# Patient Record
Sex: Female | Born: 1940 | Race: White | Hispanic: No | Smoking: Never smoker
Health system: Southern US, Community
[De-identification: ages and names within clinical notes are randomized; demographics above are authoritative.]

## PROBLEM LIST (undated history)

## (undated) DIAGNOSIS — I4891 Unspecified atrial fibrillation: Secondary | ICD-10-CM

## (undated) DIAGNOSIS — IMO0002 Reserved for concepts with insufficient information to code with codable children: Secondary | ICD-10-CM

## (undated) DIAGNOSIS — C801 Malignant (primary) neoplasm, unspecified: Secondary | ICD-10-CM

## (undated) DIAGNOSIS — K219 Gastro-esophageal reflux disease without esophagitis: Secondary | ICD-10-CM

## (undated) DIAGNOSIS — W19XXXA Unspecified fall, initial encounter: Secondary | ICD-10-CM

## (undated) DIAGNOSIS — D649 Anemia, unspecified: Secondary | ICD-10-CM

## (undated) DIAGNOSIS — M810 Age-related osteoporosis without current pathological fracture: Secondary | ICD-10-CM

## (undated) DIAGNOSIS — I10 Essential (primary) hypertension: Secondary | ICD-10-CM

## (undated) DIAGNOSIS — Z9221 Personal history of antineoplastic chemotherapy: Secondary | ICD-10-CM

## (undated) DIAGNOSIS — E669 Obesity, unspecified: Secondary | ICD-10-CM

## (undated) HISTORY — DX: Gastro-esophageal reflux disease without esophagitis: K21.9

## (undated) HISTORY — DX: Essential (primary) hypertension: I10

## (undated) HISTORY — PX: BREAST BIOPSY: SHX20

## (undated) HISTORY — PX: COLONOSCOPY: SHX174

## (undated) HISTORY — DX: Malignant (primary) neoplasm, unspecified: C80.1

## (undated) HISTORY — DX: Reserved for concepts with insufficient information to code with codable children: IMO0002

## (undated) HISTORY — DX: Unspecified fall, initial encounter: W19.XXXA

## (undated) HISTORY — DX: Personal history of antineoplastic chemotherapy: Z92.21

## (undated) HISTORY — DX: Age-related osteoporosis without current pathological fracture: M81.0

## (undated) HISTORY — DX: Anemia, unspecified: D64.9

## (undated) HISTORY — PX: OTHER SURGICAL HISTORY: SHX169

## (undated) HISTORY — PX: SKIN LESION EXCISION: SHX2412

## (undated) HISTORY — DX: Obesity, unspecified: E66.9

---

## 2004-10-14 ENCOUNTER — Ambulatory Visit: Payer: Self-pay | Admitting: General Surgery

## 2005-05-12 ENCOUNTER — Ambulatory Visit: Payer: Self-pay | Admitting: General Surgery

## 2006-03-11 ENCOUNTER — Ambulatory Visit: Payer: Self-pay | Admitting: Unknown Physician Specialty

## 2006-06-09 ENCOUNTER — Ambulatory Visit: Payer: Self-pay | Admitting: General Surgery

## 2007-01-06 ENCOUNTER — Ambulatory Visit: Payer: Self-pay | Admitting: General Surgery

## 2007-05-06 ENCOUNTER — Ambulatory Visit: Payer: Self-pay | Admitting: Internal Medicine

## 2007-07-07 ENCOUNTER — Ambulatory Visit: Payer: Self-pay | Admitting: General Surgery

## 2008-07-10 ENCOUNTER — Ambulatory Visit: Payer: Self-pay | Admitting: General Surgery

## 2009-05-25 ENCOUNTER — Ambulatory Visit: Payer: Self-pay | Admitting: General Surgery

## 2009-07-06 DIAGNOSIS — C801 Malignant (primary) neoplasm, unspecified: Secondary | ICD-10-CM

## 2009-07-06 HISTORY — DX: Malignant (primary) neoplasm, unspecified: C80.1

## 2009-07-19 ENCOUNTER — Ambulatory Visit: Payer: Self-pay | Admitting: General Surgery

## 2009-08-02 ENCOUNTER — Ambulatory Visit: Payer: Self-pay | Admitting: General Surgery

## 2009-08-06 ENCOUNTER — Ambulatory Visit: Payer: Self-pay | Admitting: Oncology

## 2009-08-07 ENCOUNTER — Ambulatory Visit: Payer: Self-pay | Admitting: General Surgery

## 2009-08-14 ENCOUNTER — Ambulatory Visit: Payer: Self-pay | Admitting: Oncology

## 2009-08-21 ENCOUNTER — Ambulatory Visit: Payer: Self-pay | Admitting: General Surgery

## 2009-08-23 ENCOUNTER — Ambulatory Visit: Payer: Self-pay | Admitting: General Surgery

## 2009-09-05 ENCOUNTER — Ambulatory Visit: Payer: Self-pay | Admitting: Oncology

## 2009-09-25 ENCOUNTER — Inpatient Hospital Stay: Payer: Self-pay | Admitting: Oncology

## 2009-10-06 ENCOUNTER — Ambulatory Visit: Payer: Self-pay | Admitting: Oncology

## 2009-10-06 HISTORY — PX: BREAST SURGERY: SHX581

## 2009-11-06 ENCOUNTER — Ambulatory Visit: Payer: Self-pay | Admitting: Oncology

## 2009-12-04 ENCOUNTER — Ambulatory Visit: Payer: Self-pay | Admitting: Oncology

## 2010-01-04 ENCOUNTER — Ambulatory Visit: Payer: Self-pay | Admitting: Oncology

## 2010-02-03 ENCOUNTER — Ambulatory Visit: Payer: Self-pay | Admitting: Oncology

## 2010-02-25 ENCOUNTER — Ambulatory Visit: Payer: Self-pay | Admitting: General Surgery

## 2010-03-05 ENCOUNTER — Ambulatory Visit: Payer: Self-pay | Admitting: General Surgery

## 2010-03-06 ENCOUNTER — Ambulatory Visit: Payer: Self-pay | Admitting: Oncology

## 2010-04-05 ENCOUNTER — Ambulatory Visit: Payer: Self-pay | Admitting: Oncology

## 2010-05-06 ENCOUNTER — Ambulatory Visit: Payer: Self-pay | Admitting: Oncology

## 2010-06-06 ENCOUNTER — Ambulatory Visit: Payer: Self-pay | Admitting: Oncology

## 2010-07-02 LAB — CANCER ANTIGEN 27.29: CA 27.29: 37.4 U/mL (ref 0.0–38.6)

## 2010-07-06 ENCOUNTER — Ambulatory Visit: Payer: Self-pay | Admitting: Oncology

## 2010-07-22 ENCOUNTER — Ambulatory Visit: Payer: Self-pay | Admitting: General Surgery

## 2010-08-12 ENCOUNTER — Ambulatory Visit: Payer: Self-pay | Admitting: Oncology

## 2010-09-05 ENCOUNTER — Ambulatory Visit: Payer: Self-pay | Admitting: Oncology

## 2010-09-24 LAB — CANCER ANTIGEN 27.29: CA 27.29: 39.8 U/mL — ABNORMAL HIGH (ref 0.0–38.6)

## 2010-10-06 ENCOUNTER — Ambulatory Visit: Payer: Self-pay | Admitting: Oncology

## 2010-10-06 DIAGNOSIS — W19XXXA Unspecified fall, initial encounter: Secondary | ICD-10-CM

## 2010-10-06 HISTORY — DX: Unspecified fall, initial encounter: W19.XXXA

## 2010-11-06 ENCOUNTER — Ambulatory Visit: Payer: Self-pay | Admitting: Oncology

## 2010-12-05 ENCOUNTER — Ambulatory Visit: Payer: Self-pay | Admitting: Oncology

## 2010-12-25 LAB — CANCER ANTIGEN 27.29: CA 27.29: 36.7 U/mL (ref 0.0–38.6)

## 2011-01-05 ENCOUNTER — Ambulatory Visit: Payer: Self-pay | Admitting: Oncology

## 2011-01-14 ENCOUNTER — Ambulatory Visit: Payer: Self-pay | Admitting: General Surgery

## 2011-02-04 ENCOUNTER — Ambulatory Visit: Payer: Self-pay | Admitting: Oncology

## 2011-04-01 ENCOUNTER — Ambulatory Visit: Payer: Self-pay | Admitting: Oncology

## 2011-04-06 ENCOUNTER — Ambulatory Visit: Payer: Self-pay | Admitting: Oncology

## 2011-05-14 ENCOUNTER — Ambulatory Visit: Payer: Self-pay | Admitting: Oncology

## 2011-06-07 ENCOUNTER — Ambulatory Visit: Payer: Self-pay | Admitting: Oncology

## 2011-07-03 LAB — CANCER ANTIGEN 27.29: CA 27.29: 36.4 U/mL (ref 0.0–38.6)

## 2011-07-07 ENCOUNTER — Ambulatory Visit: Payer: Self-pay | Admitting: Oncology

## 2011-07-24 ENCOUNTER — Ambulatory Visit: Payer: Self-pay | Admitting: General Surgery

## 2011-10-09 ENCOUNTER — Ambulatory Visit: Payer: Self-pay | Admitting: Oncology

## 2011-11-07 ENCOUNTER — Ambulatory Visit: Payer: Self-pay | Admitting: Oncology

## 2012-01-08 ENCOUNTER — Ambulatory Visit: Payer: Self-pay | Admitting: Oncology

## 2012-01-26 ENCOUNTER — Ambulatory Visit: Payer: Self-pay | Admitting: General Surgery

## 2012-02-04 ENCOUNTER — Ambulatory Visit: Payer: Self-pay | Admitting: Oncology

## 2012-04-05 HISTORY — PX: TIBIA FRACTURE SURGERY: SHX806

## 2012-04-11 ENCOUNTER — Inpatient Hospital Stay: Payer: Self-pay | Admitting: Orthopedic Surgery

## 2012-04-11 LAB — COMPREHENSIVE METABOLIC PANEL WITH GFR
Albumin: 3.3 g/dL — ABNORMAL LOW
Alkaline Phosphatase: 76 U/L
Anion Gap: 8
BUN: 15 mg/dL
Bilirubin,Total: 0.3 mg/dL
Calcium, Total: 8.6 mg/dL
Chloride: 100 mmol/L
Co2: 25 mmol/L
Creatinine: 0.89 mg/dL
EGFR (African American): >60
EGFR (Non-African Amer.): >60
Glucose: 119 mg/dL — ABNORMAL HIGH
Osmolality: 268
Potassium: 3.5 mmol/L
SGOT(AST): 27 U/L
SGPT (ALT): 21 U/L
Sodium: 133 mmol/L — ABNORMAL LOW
Total Protein: 6.1 g/dL — ABNORMAL LOW

## 2012-04-11 LAB — URINALYSIS, COMPLETE
Bacteria: NONE SEEN
Blood: NEGATIVE
Glucose,UR: NEGATIVE mg/dL (ref 0–75)
Ketone: NEGATIVE
Leukocyte Esterase: NEGATIVE
Nitrite: NEGATIVE

## 2012-04-11 LAB — CBC
HCT: 34.3 % — ABNORMAL LOW
HGB: 11.5 g/dL — ABNORMAL LOW
MCH: 29.3 pg
MCHC: 33.5 g/dL
MCV: 88 fL
Platelet: 372 x10 3/mm 3
RBC: 3.92 X10 6/mm 3
RDW: 14.6 % — ABNORMAL HIGH
WBC: 7.6 x10 3/mm 3

## 2012-04-12 LAB — CBC WITH DIFFERENTIAL/PLATELET
Basophil #: 0 10*3/uL (ref 0.0–0.1)
Basophil %: 0.1 %
Eosinophil #: 0.2 10*3/uL (ref 0.0–0.7)
HCT: 32 % — ABNORMAL LOW (ref 35.0–47.0)
HGB: 10.6 g/dL — ABNORMAL LOW (ref 12.0–16.0)
Lymphocyte #: 1.1 10*3/uL (ref 1.0–3.6)
MCH: 28.9 pg (ref 26.0–34.0)
MCHC: 33 g/dL (ref 32.0–36.0)
MCV: 88 fL (ref 80–100)
Monocyte #: 0.9 x10 3/mm (ref 0.2–0.9)
Neutrophil #: 10.8 10*3/uL — ABNORMAL HIGH (ref 1.4–6.5)
RDW: 14.7 % — ABNORMAL HIGH (ref 11.5–14.5)

## 2012-04-12 LAB — BASIC METABOLIC PANEL
Anion Gap: 7 (ref 7–16)
BUN: 12 mg/dL (ref 7–18)
Chloride: 102 mmol/L (ref 98–107)
Creatinine: 0.76 mg/dL (ref 0.60–1.30)
EGFR (Non-African Amer.): >60
Glucose: 99 mg/dL (ref 65–99)
Osmolality: 274 (ref 275–301)
Potassium: 3.5 mmol/L (ref 3.5–5.1)
Sodium: 137 mmol/L (ref 136–145)

## 2012-04-13 LAB — COMPREHENSIVE METABOLIC PANEL
Albumin: 2.2 g/dL — ABNORMAL LOW (ref 3.4–5.0)
Alkaline Phosphatase: 50 U/L (ref 50–136)
Anion Gap: 9 (ref 7–16)
BUN: 12 mg/dL (ref 7–18)
Bilirubin,Total: 0.5 mg/dL (ref 0.2–1.0)
Co2: 25 mmol/L (ref 21–32)
Creatinine: 0.81 mg/dL (ref 0.60–1.30)
Glucose: 94 mg/dL (ref 65–99)
Osmolality: 264 (ref 275–301)
SGOT(AST): 33 U/L (ref 15–37)
Sodium: 132 mmol/L — ABNORMAL LOW (ref 136–145)
Total Protein: 4.8 g/dL — ABNORMAL LOW (ref 6.4–8.2)

## 2012-04-13 LAB — CBC WITH DIFFERENTIAL/PLATELET
Basophil %: 0.3 %
Eosinophil #: 0.4 10*3/uL (ref 0.0–0.7)
Eosinophil %: 4.9 %
HGB: 8.3 g/dL — ABNORMAL LOW (ref 12.0–16.0)
Lymphocyte #: 1 10*3/uL (ref 1.0–3.6)
Lymphocyte %: 11.6 %
Monocyte %: 7.4 %
Neutrophil %: 75.8 %
RBC: 2.81 10*6/uL — ABNORMAL LOW (ref 3.80–5.20)

## 2012-04-14 LAB — BASIC METABOLIC PANEL
BUN: 7 mg/dL (ref 7–18)
Calcium, Total: 8.2 mg/dL — ABNORMAL LOW (ref 8.5–10.1)
Chloride: 101 mmol/L (ref 98–107)
Co2: 27 mmol/L (ref 21–32)
Osmolality: 268 (ref 275–301)
Potassium: 4.1 mmol/L (ref 3.5–5.1)

## 2012-04-15 ENCOUNTER — Encounter: Payer: Self-pay | Admitting: Internal Medicine

## 2012-04-15 LAB — CBC WITH DIFFERENTIAL/PLATELET
Basophil #: 0 x10 3/mm 3
Basophil %: 0.3 %
Eosinophil #: 0.3 x10 3/mm 3
Eosinophil %: 2.4 %
HCT: 28.4 % — ABNORMAL LOW
HGB: 9.6 g/dL — ABNORMAL LOW
Lymphocyte %: 9.7 %
Lymphs Abs: 1 x10 3/mm 3
MCH: 29.8 pg
MCHC: 34 g/dL
MCV: 88 fL
Monocyte #: 0.8 "x10 3/mm "
Monocyte %: 7.9 %
Neutrophil #: 8.6 x10 3/mm 3 — ABNORMAL HIGH
Neutrophil %: 79.7 %
Platelet: 323 x10 3/mm 3
RBC: 3.24 X10 6/mm 3 — ABNORMAL LOW
RDW: 14.6 % — ABNORMAL HIGH
WBC: 10.7 x10 3/mm 3

## 2012-04-15 LAB — BASIC METABOLIC PANEL WITH GFR
Anion Gap: 10
BUN: 10 mg/dL
Calcium, Total: 9.2 mg/dL
Chloride: 101 mmol/L
Co2: 25 mmol/L
Creatinine: 0.65 mg/dL
EGFR (African American): >60
EGFR (Non-African Amer.): >60
Glucose: 120 mg/dL — ABNORMAL HIGH
Osmolality: 272
Potassium: 4.5 mmol/L
Sodium: 136 mmol/L

## 2012-04-21 LAB — CBC WITH DIFFERENTIAL/PLATELET
Eosinophil %: 6.1 %
Lymphocyte #: 1.4 10*3/uL (ref 1.0–3.6)
Lymphocyte %: 15.6 %
MCH: 29 pg (ref 26.0–34.0)
MCHC: 31.9 g/dL — ABNORMAL LOW (ref 32.0–36.0)
MCV: 91 fL (ref 80–100)
Monocyte %: 9.6 %
Neutrophil #: 6 10*3/uL (ref 1.4–6.5)
WBC: 8.8 10*3/uL (ref 3.6–11.0)

## 2012-04-30 ENCOUNTER — Ambulatory Visit: Payer: Self-pay | Admitting: Internal Medicine

## 2012-05-04 LAB — BASIC METABOLIC PANEL
Anion Gap: 11 (ref 7–16)
BUN: 13 mg/dL (ref 7–18)
Calcium, Total: 8.7 mg/dL (ref 8.5–10.1)
Chloride: 101 mmol/L (ref 98–107)
Creatinine: 0.64 mg/dL (ref 0.60–1.30)
EGFR (African American): >60
EGFR (Non-African Amer.): >60
Glucose: 137 mg/dL — ABNORMAL HIGH (ref 65–99)

## 2012-05-06 ENCOUNTER — Encounter: Payer: Self-pay | Admitting: Internal Medicine

## 2012-06-11 ENCOUNTER — Ambulatory Visit: Payer: Self-pay | Admitting: Radiation Oncology

## 2012-07-06 ENCOUNTER — Ambulatory Visit: Payer: Self-pay | Admitting: Radiation Oncology

## 2012-07-29 ENCOUNTER — Ambulatory Visit: Payer: Self-pay | Admitting: General Surgery

## 2012-12-09 ENCOUNTER — Ambulatory Visit: Payer: Self-pay | Admitting: Oncology

## 2012-12-22 LAB — CANCER ANTIGEN 27.29: CA 27.29: 46.8 U/mL — ABNORMAL HIGH (ref 0.0–38.6)

## 2013-01-04 ENCOUNTER — Ambulatory Visit: Payer: Self-pay | Admitting: Oncology

## 2013-03-07 ENCOUNTER — Encounter: Payer: Self-pay | Admitting: *Deleted

## 2013-06-27 ENCOUNTER — Ambulatory Visit: Payer: Self-pay | Admitting: Oncology

## 2013-06-28 LAB — CANCER ANTIGEN 27.29: CA 27.29: 40.9 U/mL — ABNORMAL HIGH (ref 0.0–38.6)

## 2013-07-06 ENCOUNTER — Ambulatory Visit: Payer: Self-pay | Admitting: Oncology

## 2013-08-02 ENCOUNTER — Encounter: Payer: Self-pay | Admitting: General Surgery

## 2013-08-02 ENCOUNTER — Ambulatory Visit: Payer: Self-pay | Admitting: General Surgery

## 2013-08-10 ENCOUNTER — Ambulatory Visit: Payer: Self-pay | Admitting: General Surgery

## 2013-08-18 ENCOUNTER — Encounter: Payer: Self-pay | Admitting: General Surgery

## 2013-08-18 ENCOUNTER — Ambulatory Visit (INDEPENDENT_AMBULATORY_CARE_PROVIDER_SITE_OTHER): Payer: Medicare Other | Admitting: General Surgery

## 2013-08-18 VITALS — BP 160/86 | HR 60 | Resp 14 | Ht 60.0 in | Wt 182.0 lb

## 2013-08-18 DIAGNOSIS — C50919 Malignant neoplasm of unspecified site of unspecified female breast: Secondary | ICD-10-CM

## 2013-08-18 NOTE — Patient Instructions (Signed)
The patient is aware to call back for any questions or concerns.  

## 2013-08-18 NOTE — Progress Notes (Signed)
rulePatient ID:  Joyce Horton, female   DOB: October 04, 1941, 72 y.o.   MRN: [id number]  Chief Complaint  Patient presents with  . Follow-up    mammogram    HPI Joyce Horton is a 72 y.o. female  who presents for annual breast evaluation. The most recent mammogram was done on 08/02/13 Cat 2. Patient does perform regular self breast checks and gets regular mammograms done.  No new breast complaints. She is having more trouble with the right leg and is in a wheelchair.   HPI  Past Medical History  Diagnosis Date  . Hypertension   . Ulcer   . Osteoporosis   . Obesity   . History of chemotherapy   . Anemia   . GERD (gastroesophageal reflux disease)   . Fall 2012  . 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 Neoadjuvant chemotherapy. Wide local excision, sentinel node biopsy completed May 2011 followed by whole breast radiation.    Past Surgical History  Procedure Laterality Date  . Colonoscopy  2002,2007,2010    Joyce Horton  . Breast biopsy  2000,2007,2010    left breast 2010  . Breast surgery Left 2011    lumpectomy  . Skin lesion excision  [id number]  . Stents      R leg  . Tibia fracture surgery      Family History  Problem Relation Age of Onset  . Cancer Mother     breast    Social History History  Substance Use Topics  . Smoking status: Never Smoker   . Smokeless tobacco: Never Used  . Alcohol Use: Yes    No Known Allergies  Current Outpatient Prescriptions  Medication Sig Dispense Refill  . ALPRAZolam (XANAX) 0.25 MG tablet Take 0.25 mg by mouth at bedtime as needed for anxiety.      Marland Kitchen aspirin 81 MG tablet Take 81 mg by mouth daily.      . Multiple Vitamins-Minerals (MULTIVITAMIN WITH MINERALS) tablet Take 1 tablet by mouth daily.      . Nebivolol HCl (BYSTOLIC PO) Take by mouth.       No current facility-administered medications for this visit.    Review of Systems Review of Systems  Constitutional: Negative.    Respiratory: Negative.   Cardiovascular: Negative.     Blood pressure 160/86, pulse 60, resp. rate 14, height 5' (1.524 m), weight 182 lb (82.555 kg).  Physical Exam Physical Exam  Constitutional: She is oriented to person, place, and time. She appears well-developed and well-nourished.  Cardiovascular: Normal rate, regular rhythm and normal heart sounds.   Pulmonary/Chest: Effort normal and breath sounds normal. Right breast exhibits no inverted nipple, no mass, no nipple discharge, no skin change and no tenderness. Left breast exhibits inverted nipple (since surgery). Left breast exhibits no mass, no nipple discharge, no skin change and no tenderness.  well healed incision upper half of left breast  Lymphadenopathy:    She has no cervical adenopathy.    She has no axillary adenopathy.  Neurological: She is alert and oriented to person, place, and time.  Skin: Skin is warm and dry.    Data Reviewed Bilateral mammograms Dr. 28, 2014 showed no interval change. BI-RAD-2.  Assessment    Doing well now 4 years after initial diagnosis.     Plan    Followup exam with bilateral mammograms in one year.        Joyce Horton 08/19/2013, 5:51 AM

## 2013-08-19 ENCOUNTER — Ambulatory Visit: Payer: Self-pay | Admitting: Orthopedic Surgery

## 2013-08-19 ENCOUNTER — Encounter: Payer: Self-pay | Admitting: General Surgery

## 2013-08-19 ENCOUNTER — Encounter: Payer: Self-pay | Admitting: *Deleted

## 2013-08-19 DIAGNOSIS — C50112 Malignant neoplasm of central portion of left female breast: Secondary | ICD-10-CM | POA: Insufficient documentation

## 2013-08-29 ENCOUNTER — Encounter: Payer: Self-pay | Admitting: General Surgery

## 2013-12-23 ENCOUNTER — Ambulatory Visit: Payer: Self-pay | Admitting: Oncology

## 2013-12-31 LAB — CANCER ANTIGEN 27.29: CA 27.29: 57.3 U/mL — ABNORMAL HIGH (ref 0.0–38.6)

## 2014-01-04 ENCOUNTER — Ambulatory Visit: Payer: Self-pay | Admitting: Oncology

## 2014-07-04 ENCOUNTER — Ambulatory Visit: Payer: Self-pay | Admitting: Oncology

## 2014-07-06 ENCOUNTER — Ambulatory Visit: Payer: Self-pay | Admitting: Oncology

## 2014-07-07 LAB — CANCER ANTIGEN 27.29: CA 27.29: 50.7 U/mL — ABNORMAL HIGH (ref 0.0–38.6)

## 2014-08-06 ENCOUNTER — Ambulatory Visit: Payer: Self-pay | Admitting: Oncology

## 2014-08-07 ENCOUNTER — Encounter: Payer: Self-pay | Admitting: General Surgery

## 2014-08-08 ENCOUNTER — Encounter: Payer: Self-pay | Admitting: General Surgery

## 2014-08-09 ENCOUNTER — Other Ambulatory Visit: Payer: Medicare Other

## 2014-08-09 ENCOUNTER — Encounter: Payer: Self-pay | Admitting: General Surgery

## 2014-08-09 ENCOUNTER — Ambulatory Visit (INDEPENDENT_AMBULATORY_CARE_PROVIDER_SITE_OTHER): Payer: Medicare Other | Admitting: General Surgery

## 2014-08-09 VITALS — BP 146/82 | HR 88 | Resp 20 | Ht 60.0 in | Wt 180.0 lb

## 2014-08-09 DIAGNOSIS — C50919 Malignant neoplasm of unspecified site of unspecified female breast: Secondary | ICD-10-CM

## 2014-08-09 NOTE — Progress Notes (Addendum)
Patient ID: JUNG INGERSON, female   DOB: 04/21/1941, 73 y.o.   MRN: [id number]  Chief Complaint  Patient presents with  . Follow-up    1 year mammogram and colonoscopy discussion    HPI Joyce Horton is a 73 y.o. female who presents for a breast cancer follow up. The most recent mammogram and left breast ultrasound was done on 08/03/14. Patient does perform regular self breast checks and gets regular mammograms done.  The patient denies any new problems with the breasts. The patient is also here to discuss a colonoscopy today. Her last colonoscopy was done in 2010. No current GI issues.    HPI  Past Medical History  Diagnosis Date  . Hypertension   . Ulcer   . Osteoporosis   . Obesity   . History of chemotherapy   . Anemia   . GERD (gastroesophageal reflux disease)   . Fall 2012  . 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 Neoadjuvant chemotherapy. Wide local excision, sentinel node biopsy completed May 2011 followed by whole breast radiation.    Past Surgical History  Procedure Laterality Date  . Colonoscopy  2002,2007,2010    Joyce Horton  . Breast biopsy  2000,2007,2010    left breast 2010  . Breast surgery Left 2011    lumpectomy  . Skin lesion excision  [id number]  . Stents      R leg  . Tibia fracture surgery Right July 2013    right femur fracture with internal fixation.    Family History  Problem Relation Age of Onset  . Cancer Mother     breast    Social History History  Substance Use Topics  . Smoking status: Never Smoker   . Smokeless tobacco: Never Used  . Alcohol Use: 0.0 oz/week    0 Not specified per week    Allergies  Allergen Reactions  . Fentanyl Other (See Comments)    Hallucinations and mental alterations    Current Outpatient Prescriptions  Medication Sig Dispense Refill  . acetaminophen (TYLENOL) 500 MG tablet Take 500 mg by mouth every 6 (six) hours as needed.    . ALPRAZolam (XANAX) 0.25 MG tablet  Take 0.25 mg by mouth at bedtime as needed for anxiety.    Marland Kitchen aspirin 81 MG tablet Take 81 mg by mouth daily.    . cetirizine (ZYRTEC) 10 MG chewable tablet Chew 10 mg by mouth as needed for allergies.    Marland Kitchen FLUoxetine (PROZAC) 20 MG tablet Take 20 mg by mouth daily.    Marland Kitchen lisinopril-hydrochlorothiazide (PRINZIDE,ZESTORETIC) 20-25 MG per tablet Take 1 tablet by mouth daily.    . Multiple Vitamins-Minerals (MULTIVITAMIN WITH MINERALS) tablet Take 1 tablet by mouth daily.    . Nebivolol HCl (BYSTOLIC PO) Take 10 mg by mouth daily.     Marland Kitchen oxybutynin (DITROPAN) 5 MG tablet Take 5 mg by mouth 2 (two) times daily.     No current facility-administered medications for this visit.    Review of Systems Review of Systems  Constitutional: Negative.   Respiratory: Negative.   Cardiovascular: Negative.   Gastrointestinal: Negative.     Blood pressure 146/82, pulse 88, resp. rate 20, height 5' (1.524 m), weight 180 lb (81.647 kg).  Physical Exam Physical Exam  Constitutional: She is oriented to person, place, and time. She appears well-developed and well-nourished.  Neck: Neck supple. No thyromegaly present.  Cardiovascular: Normal rate, regular rhythm and normal heart sounds.   No  murmur heard. Pulmonary/Chest: Effort normal and breath sounds normal. Right breast exhibits no inverted nipple, no mass, no nipple discharge, no skin change and no tenderness. Left breast exhibits inverted nipple (unchanged from before) and skin change (faint redness and warmness to left breast). Left breast exhibits no mass, no nipple discharge and no tenderness.    Abdominal: Soft. Normal appearance and bowel sounds are normal. There is no hepatosplenomegaly. There is no tenderness. No hernia.  Lymphadenopathy:    She has no cervical adenopathy.    She has no axillary adenopathy.  Neurological: She is alert and oriented to person, place, and time.  Skin: Skin is warm and dry.    Data Reviewed Bilateral mammogram  and ultrasound of the left breast over 29, 2015 showed increased masslike density was skin thickening and retraction the upper-outer quadrant left breast. Ultrasound described a 0.9 x 1.6 x 2.0 cm hypoechoic mass in the 2:00 position 7 cm from the nipple. BI-RADS-5.  Ultrasound examination today showed a 0.9 x 1.25 x 1.5 cm irregular hypoechoic mass in the 2:00 position of the left breast, 7 cm from the nipple, corresponding to the hospital study. Adjacent to this was a 7 mm hypoechoic mass. The smaller mass was lateral to the dominant lesion. These were both in the area of previous boost after whole breast radiation.  The patient was amenable to biopsy. 10 mL of 0.5% Xylocaine with 0.25% Marcaine with 1-200,000 units of epinephrine was utilized well tolerated. The skin was cleaned with ChloraPrep. A 14-gauge vacuum device was utilized and approximately 10 core samples were obtained from both the dominant and the nondominant lesions. Postbiopsy clip was placed. Scant bleeding was noted. The return material was suggestive of fat necrosis. Skin defect was closed with benzoin and Steri-Strips followed by Telfa and Tegaderm dressing. Postbiopsy instructions were provided.  Assessment    Interval change in mammographic appearance of the previously treated breast. Now 5 years status post wide excision and sentinel node biopsy after neoadjuvant chemotherapy with whole breast radiation.     Plan    The patient will be contacted when pathology results are available.     PCP:  Idelle Crouch Ref MD: Dr. Charletta Cousin, Forest Gleason 08/11/2014, 2:54 PM

## 2014-08-09 NOTE — Patient Instructions (Signed)

## 2014-08-11 ENCOUNTER — Encounter: Payer: Self-pay | Admitting: General Surgery

## 2014-08-11 ENCOUNTER — Telehealth: Payer: Self-pay | Admitting: General Surgery

## 2014-08-11 LAB — PATHOLOGY

## 2014-08-11 NOTE — Telephone Encounter (Signed)
Telephone report from Delorse Lek, M.D. from pathology shows only fat necrosis.  Patient notified.  She will follow up next week with the nurse as scheduled.

## 2014-08-16 ENCOUNTER — Ambulatory Visit: Payer: Medicare Other

## 2014-08-17 ENCOUNTER — Encounter: Payer: Self-pay | Admitting: General Surgery

## 2014-08-17 ENCOUNTER — Ambulatory Visit (INDEPENDENT_AMBULATORY_CARE_PROVIDER_SITE_OTHER): Payer: Self-pay | Admitting: General Surgery

## 2014-08-17 VITALS — BP 130/82 | HR 86 | Resp 16 | Ht 60.0 in | Wt 185.0 lb

## 2014-08-17 DIAGNOSIS — C50919 Malignant neoplasm of unspecified site of unspecified female breast: Secondary | ICD-10-CM

## 2014-08-17 DIAGNOSIS — N641 Fat necrosis of breast: Secondary | ICD-10-CM

## 2014-08-17 NOTE — Patient Instructions (Signed)
Patient to return in 6 months with left breast diagnostic mammogram. Continue self breast exams. Call office for any new breast issues or concerns.

## 2014-08-17 NOTE — Progress Notes (Signed)
Patient ID: Joyce Horton, female   DOB: Jan 10, 1941, 73 y.o.   MRN: [id number]  Chief Complaint  Patient presents with  . Follow-up    left breast biopsy    HPI Joyce Horton is a 73 y.o. female here today for her post op left breast biopsy done on 08/09/14. Patient states she is doing well. No new complaints at this time.  HPI  Past Medical History  Diagnosis Date  . Hypertension   . Ulcer   . Osteoporosis   . Obesity   . History of chemotherapy   . Anemia   . GERD (gastroesophageal reflux disease)   . Fall 2012  . 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 Neoadjuvant chemotherapy. Wide local excision, sentinel node biopsy completed May 2011 followed by whole breast radiation.    Past Surgical History  Procedure Laterality Date  . Colonoscopy  2002,2007,2010    Bryen Hinderman  . Breast biopsy  2000,2007,2010    left breast 2010  . Breast surgery Left 2011    lumpectomy  . Skin lesion excision  [id number]  . Stents      R leg  . Tibia fracture surgery Right July 2013    right femur fracture with internal fixation.    Family History  Problem Relation Age of Onset  . Cancer Mother     breast    Social History History  Substance Use Topics  . Smoking status: Never Smoker   . Smokeless tobacco: Never Used  . Alcohol Use: 0.0 oz/week    0 Not specified per week    Allergies  Allergen Reactions  . Fentanyl Other (See Comments)    Hallucinations and mental alterations    Current Outpatient Prescriptions  Medication Sig Dispense Refill  . acetaminophen (TYLENOL) 500 MG tablet Take 500 mg by mouth every 6 (six) hours as needed.    . ALPRAZolam (XANAX) 0.25 MG tablet Take 0.25 mg by mouth at bedtime as needed for anxiety.    Marland Kitchen aspirin 81 MG tablet Take 81 mg by mouth daily.    . cetirizine (ZYRTEC) 10 MG chewable tablet Chew 10 mg by mouth as needed for allergies.    Marland Kitchen FLUoxetine (PROZAC) 20 MG tablet Take 20 mg by mouth daily.     Marland Kitchen lisinopril-hydrochlorothiazide (PRINZIDE,ZESTORETIC) 20-25 MG per tablet Take 1 tablet by mouth daily.    . Multiple Vitamins-Minerals (MULTIVITAMIN WITH MINERALS) tablet Take 1 tablet by mouth daily.    . Nebivolol HCl (BYSTOLIC PO) Take 10 mg by mouth daily.     Marland Kitchen oxybutynin (DITROPAN) 5 MG tablet Take 5 mg by mouth 2 (two) times daily.     No current facility-administered medications for this visit.    Review of Systems Review of Systems  Constitutional: Negative.   Respiratory: Negative.   Cardiovascular: Negative.     Blood pressure 130/82, pulse 86, resp. rate 16, height 5' (1.524 m), weight 185 lb (83.915 kg).  Physical Exam Physical Exam  Constitutional: She is oriented to person, place, and time. She appears well-developed and well-nourished.  Pulmonary/Chest:  Well healing left breast biopsy site.   Neurological: She is alert and oriented to person, place, and time.  Skin: Skin is warm and dry.    Data Reviewed Biopsies showed fat necrosis. Both areas of concern on ultrasound were sampled.  Assessment    Doing well status post left breast biopsy for abnormal mammogram.     Plan  I suspect the patient has developed fat necrosis based on her previous surgery as well as her radiation. The mild erythema of the breast in thickening of the skin is likely secondary to lymphatic obstruction at the surgical site rather than secondary to malignancy. Considering the large volume of sample obtained of the time of her vacuum assisted biopsy, I think we can observe this at present as she is asymptomatic.  Arrangements will made for a follow-up left breast mammogram and office visit in 6 months.     PCP:  Bonner Puna 08/18/2014, 8:36 PM

## 2014-08-18 DIAGNOSIS — N641 Fat necrosis of breast: Secondary | ICD-10-CM | POA: Insufficient documentation

## 2014-12-28 ENCOUNTER — Emergency Department: Payer: Self-pay | Admitting: Emergency Medicine

## 2014-12-28 LAB — URINALYSIS, COMPLETE
Bilirubin,UR: NEGATIVE
GLUCOSE, UR: NEGATIVE mg/dL (ref 0–75)
Ketone: NEGATIVE
LEUKOCYTE ESTERASE: NEGATIVE
NITRITE: NEGATIVE
Ph: 7 (ref 4.5–8.0)
Protein: NEGATIVE
Specific Gravity: 1.009 (ref 1.003–1.030)
Squamous Epithelial: <1
WBC UR: 2 /HPF (ref 0–5)

## 2014-12-28 LAB — CBC WITH DIFFERENTIAL/PLATELET
BASOS PCT: 0.5 %
Basophil #: 0 10*3/uL (ref 0.0–0.1)
Eosinophil #: 0.2 10*3/uL (ref 0.0–0.7)
Eosinophil %: 1.9 %
HCT: 35.9 % (ref 35.0–47.0)
HGB: 11.3 g/dL — ABNORMAL LOW (ref 12.0–16.0)
LYMPHS PCT: 21.1 %
Lymphocyte #: 2.2 10*3/uL (ref 1.0–3.6)
MCH: 25 pg — ABNORMAL LOW (ref 26.0–34.0)
MCHC: 31.4 g/dL — ABNORMAL LOW (ref 32.0–36.0)
MCV: 80 fL (ref 80–100)
MONOS PCT: 8.9 %
Monocyte #: 0.9 x10 3/mm (ref 0.2–0.9)
NEUTROS PCT: 67.6 %
Neutrophil #: 7 10*3/uL — ABNORMAL HIGH (ref 1.4–6.5)
Platelet: 449 10*3/uL — ABNORMAL HIGH (ref 150–440)
RBC: 4.5 10*6/uL (ref 3.80–5.20)
RDW: 17.3 % — ABNORMAL HIGH (ref 11.5–14.5)
WBC: 10.3 10*3/uL (ref 3.6–11.0)

## 2014-12-28 LAB — PROTIME-INR
INR: 1
PROTHROMBIN TIME: 12.9 s

## 2014-12-28 LAB — COMPREHENSIVE METABOLIC PANEL
ALT: 12 U/L — AB
AST: 20 U/L
Albumin: 4 g/dL
Alkaline Phosphatase: 62 U/L
Anion Gap: 10 (ref 7–16)
BUN: 20 mg/dL
Bilirubin,Total: 0.5 mg/dL
CHLORIDE: 92 mmol/L — AB
CREATININE: 0.82 mg/dL
Calcium, Total: 8.7 mg/dL — ABNORMAL LOW
Co2: 24 mmol/L
EGFR (African American): >60
GLUCOSE: 94 mg/dL
POTASSIUM: 3.6 mmol/L
SODIUM: 126 mmol/L — AB
Total Protein: 6.6 g/dL

## 2014-12-28 LAB — TROPONIN I: Troponin-I: <0.03 ng/mL

## 2014-12-28 LAB — APTT: Activated PTT: 24.1 secs (ref 23.6–35.9)

## 2015-01-02 ENCOUNTER — Ambulatory Visit
Admit: 2015-01-02 | Disposition: A | Discharge status: Home / Self Care | Payer: Self-pay | Attending: Oncology | Admitting: Oncology

## 2015-01-03 LAB — CANCER ANTIGEN 27.29: CA 27.29: 55.9 U/mL — ABNORMAL HIGH (ref 0.0–38.6)

## 2015-01-05 ENCOUNTER — Ambulatory Visit
Admit: 2015-01-05 | Disposition: A | Discharge status: Home / Self Care | Payer: Self-pay | Attending: Oncology | Admitting: Oncology

## 2015-01-12 ENCOUNTER — Ambulatory Visit
Admit: 2015-01-12 | Disposition: A | Discharge status: Home / Self Care | Payer: Self-pay | Attending: Internal Medicine | Admitting: Internal Medicine

## 2015-01-19 ENCOUNTER — Other Ambulatory Visit: Payer: Self-pay | Admitting: General Surgery

## 2015-01-19 DIAGNOSIS — N6320 Unspecified lump in the left breast, unspecified quadrant: Secondary | ICD-10-CM

## 2015-01-28 NOTE — Discharge Summary (Signed)
PATIENT NAME:  Joyce Horton, Joyce Horton MR#:  [id number] DATE OF BIRTH:  19-Apr-1941  DATE OF ADMISSION:  04/11/2012 DATE OF DISCHARGE:  04/15/2012  ADMISSION DIAGNOSIS: Right subtrochanteric hip fracture.   HISTORY OF PRESENT ILLNESS: The patient is a 74 year old female who fell while at home. She sustained a mechanical fall and fell onto her right hip. She was diagnosed with a subtrochanteric femur fracture at presentation to the [facility] Emergency Department. She was admitted to the orthopedic surgery service for further evaluation and management.   PAST MEDICAL HISTORY:  1. Migraines.  2. Difficulty with hearing.  3. Peptic ulcer disease.  4. Gastric reflux.  5. Anemia.  6. Hypertension.  7. Left breast cancer, status post lumpectomy and status post port placement.   ALLERGIES: No known drug allergies.   HOME MEDICATIONS: 1. Aleve 220 mg, 3 tabs daily p.r.n. for pain.  2. Alprazolam 0.25 mg, 1 tab b.i.d. p.r.n. for anxiety.  3. Aspirin 81 mg daily. 4. Bystolic 10 mg daily.  5. Acetaminophen/tramadol 325 mg/37 .5 mg 1 to 2 tabs q. 6 h. p.r.n. for headaches. 6. Detrol LA 4 mg extended-release daily.  7. Fish oil tablet daily.  8. Fluoxetine 20 mg daily.  9. Hydrochlorothiazide/lisinopril combo tablet 25 mg/20 mg, 1 tab daily. 10. Multivitamin daily.  11. Prilosec 20 mg delayed release, 1 tab daily. 12. Zyrtec 10 mg daily.  13. Cyclobenzaprine 10 mg, 1 tab q. 8 h.   HOSPITAL COURSE: The patient was admitted on 04/11/2012 on the day of injury. She was admitted to the orthopedic surgery service. She was seen by the hospitalist service and was cleared for surgery.   On 04/12/2012 the patient was taken to the operating room. She underwent placement of a long intramedullary rod for fixation of right subtrochanteric femur fracture. The case went without complication.  Postoperatively she returned to the orthopedic floor. She had physical and occupational therapy consults on postoperative  day #1.  She remained on postoperative antibiotics for 24 hours. The patient was seen by physical therapy on the first postoperative day. Her medical care was then assumed by Dr. Fulton Reek, her primary care physician, who continued to follow her throughout her hospitalization. The patient had daily labs drawn. Early postoperative does show to follow her hematocrit. On postoperative day #2 her hemoglobin had dropped to 8. The patient was tachycardic and relatively hypotensive and was transfused a unit of PRBCs. The patient responded well to the single unit of packed red blood cells. Her hematocrit and hemoglobin responded the following day. The patient remains hemodynamically stable. Her Foley catheter was removed on postoperative day #2 and her dressing was changed by the orthopedic surgeon on postoperative day #2 as well. She was up out of bed with therapy on postoperative day #2 and continued to make progress with them. Given the patient's clinical improvement, she was prepared for discharge as she was doing well orthopedically and she was cleared by Dr. Doy Hutching for discharge medically.   DISCHARGE INSTRUCTIONS:  1. The patient will be partial weight-bearing on the right lower extremity with a walker.  2. She should have dry sterile dressing changes daily until her incisions are completely dry.  3. She will return to my office in approximately 10 to 14 days for wound check and staple removal. She will remain on Lovenox 40 mg daily for four [place] postoperative for deep vein thrombosis prophylaxis.  4. She will remain on Vicodin for pain and a prescription was written to take  with her to her skilled nursing facility.  5. I would recommend repeating a CBC within 48 hours of her arrival at the skilled nursing facility and the results called to the facility covering physician.  6. The patient will restart all of her home medications except for her Aleve as she will be taking Lovenox. She may continue her 81  mg of aspirin.  7. The patient should also have a follow-up appointment with Dr. Fulton Reek within the next 2 to 4 [place].      DISCHARGE DIAGNOSES:    1. Status post  open reduction and internal fixation with intramedullary rod of her right subtrochanteric hip fracture. 2. Postoperative anemia.   ____________________________ Timoteo Gaul, MD [id number] D: 04/15/2012 12:50:44 ET T: 04/15/2012 13:02:25 ET JOB#: [id number]  cc: Timoteo Gaul, MD, <Dictator> Leonie Douglas. Doy Hutching, MD Timoteo Gaul MD ELECTRONICALLY SIGNED 04/16/2012 15:40

## 2015-01-28 NOTE — Op Note (Signed)
PATIENT NAME:  Joyce Horton, Joyce Horton MR#:  [id number] DATE OF BIRTH:  12/09/1940  DATE OF PROCEDURE:  04/12/2012  PREOPERATIVE DIAGNOSIS: Right subtrochanteric femur fracture.    POSTOPERATIVE DIAGNOSIS: Right subtrochanteric femur fracture.   OPERATION: Intramedullary rod fixation of right subtrochanteric femur fracture.   SURGEON: Thornton Park, MD   ANESTHESIA: Spinal.   ESTIMATED BLOOD LOSS: 450 mL.   COMPLICATIONS: None.   INDICATIONS FOR PROCEDURE: The patient is an active 74 year old female who sustained a mechanical fall at home prior to admission. She was diagnosed with a displaced subtrochanteric femur fracture in the Emergency Department. She was admitted to the Orthopedic Surgery Service for further evaluation and management. Given the patient's high level of functioning at baseline, I have recommended fixation of her subtrochanteric fracture with an intramedullary rod. I discussed the risks and benefits of the surgery with the patient and her son, along with family friends at the bedside on the night of admission. The patient's son, who is also the Power of Lynndyl, signed the consent form at the patient's request. Consent for blood transfusion was also signed at this time. They understand the risks of the procedure include infection, bleeding requiring blood transfusion, nerve or blood vessel injury leading to permanent weakness or numbness, persistent pain, failure of the hardware, malunion, nonunion, and the need for further surgery as well as medical complications which include deep vein thrombosis and pulmonary embolism, myocardial infarction, stroke, pneumonia, respiratory failure, and death. The patient understood these risks and wished to proceed with surgery.   PROCEDURE NOTE: On the day of surgery, the patient was met in the preoperative area. I signed the right thigh with the word "yes" according the hospital's right site protocol. She was brought to the Operating Room where she  underwent a spinal anesthetic by the Anesthesia Service. She was then positioned supine on the fracture table. Her right leg was placed in a leg holder for traction, and the left leg was placed in a hemilithotomy position and well padded. A central post was placed against the perineum to allow for traction, and this too was adequately padded. The patient was then prepped and draped in a sterile fashion.   A timeout was performed to verify the patient's name, date of birth, medical record number, correct site of surgery, and correct procedure to be performed. It was also used to verify the patient had received antibiotics and that all appropriate instruments, implants, and radiographic studies were available in the room. Once all in attendance were in agreement, the case began.   C-arm images of the fracture site were performed in both the AP and lateral planes. Proposed incisions were drawn out based upon the C-arm images. A #10 blade was used to make a lateral incision based over the fracture site in line with the femur. The subcutaneous tissues were dissected with electrocautery. Once the fascia lata was identified, it too was sharply incised using a deep #10 blade. The vastus lateralis muscle was then encountered. The fracture site could be digitally palpated. The vastus lateralis fascia was sharply incised using a deep #10 blade. The vastus lateralis muscle was split in line with its fibers, revealing the underlying fracture. A second incision was then made proximal to the greater trochanter and in line with the femur. This too was made with a sharp #10 blade. The soft tissues were dissected using electrocautery. The deep fascia was then incised and the abductor  muscle was split bluntly allowing for a drill pin to  be placed just at the medial tip of the greater trochanter. This was advanced into the tip of the greater trochanter. A starting awl was then placed over this guidepin, and entry into the proximal  femur was performed. An intramedullary fracture reduction device was then placed into the proximal femur. This allowed for manual extension of the proximal fragment of the fracture. Then through the inferior incision, a fracture reduction clamp was placed around the fracture. This allowed for near anatomic reduction of the fracture. The intramedullary fracture reduction tool was then removed, and a ball-tip guidewire was passed across the fracture site and into the distal femur. Its position was confirmed on AP and lateral C-arm images at the knee and confirmed to be intramedullary. The length of the guidewire was measured using a depth gauge device. The patient was found to have a nail length of 360 mm as the best fit. The guidewire was then overreamed to 13 mm. Then a right 130 degrees, 11 x 360 mm Biomet Affixus nail was then manually inserted across the fracture site and into the distal femur. Its final position at both the knee and the hip was confirmed on AP and lateral C-arm projections.   The drill guide for the lag screw was then placed through the guide arm. This was placed alongside the lateral cortex of the femur through the already established inferior incision. A guidepin was then drilled across the fracture site and into the femoral head. Its position again was confirmed on AP and lateral C-arm projections. Once a tip apex distance of less than 25 mm was achieved, this was measured with a depth gauge. It was found to be 95 mm in length. The guidepin was then overdrilled with a drill to a depth of 95 mm, and a 95 mm lag screw was then advanced over the guidepin and into position in the femoral head. Its position again was confirmed on AP and lateral C-arm projections. The set screw was then positioned at the proximal end of the nail. It was released by a quarter turn to allow for compression at the fracture site. The Affixus insertion guide arm was then removed from the nail. The fracture site  reduction was again confirmed on C-arm imaging. Traction on the right leg was then released. The attention was then turned to placement of distal femoral fixation.    Using a perfect circle technique, two distal interlocking screws were placed, one through the static hole and one through the dynamic hole in the proximal end. This was performed using a freehand technique. Once both screws were placed, final AP and lateral projections of the right distal femur were performed. Final AP and lateral images of the fracture site and the right hip were performed. The wounds were copiously irrigated. The proximal and distal incisions near the hip were then closed with the fascia closed with 0 Vicryl, the subcutaneous tissue closed with 2-0 Vicryl, and the skin approximated with staples. The two stab wounds for the distal interlocking screws were irrigated closed with a single 2-0 Vicryl suture, and the skin was approximated with staples. Dry sterile dressings were applied. The patient was then transferred to a hospital bed in stable condition. I was scrubbed and present for the entire case and all sharp and instrument counts were correct at the conclusion of the case.   I spoke with the patient's son postoperatively in the consultation room to let him know that his mother was stable in the recovery room and  that the case had gone without complication.  ____________________________ Timoteo Gaul, MD klk:cbb D: 04/18/2012 18:36:15 ET T: 04/19/2012 06:52:06 ET JOB#: [id number]  cc: Timoteo Gaul, MD, <Dictator> Timoteo Gaul MD ELECTRONICALLY SIGNED 04/20/2012 13:06

## 2015-01-28 NOTE — Consult Note (Signed)
Brief Consult Note: Diagnosis: pre op eval.   Patient was seen by consultant.   Consult note dictated.   Recommend to proceed with surgery or procedure.   Recommend further assessment or treatment.   Orders entered.   Discussed with Attending MD.   Comments: 74 yo f with htn, anxiety, depression, breast ca sp chemo/radiation in remission, s/p fall and short synocpal episodes after pw right hip fx.  pt without hx of mi/chf/cad/dm.  no active cardiac conditions and no clinical risk factors. has about 4 mets. goiong fr intermediate risk surg. continue bystolic. can proceed to surg  without further cardiac workups. no cp.  hold acei/hctz/nsaids.  resumed anxiety/depresson meds.  dvt ppx per ortho once acceptable bleeding risk. syncopy was very brief post hip fx. possibly 2/2 pain.  moniter..  Electronic Signatures: Vivien Presto (MD)  (Signed 07-Jul-13 20:33)  Authored: Brief Consult Note   Last Updated: 07-Jul-13 20:33 by Vivien Presto (MD)

## 2015-01-28 NOTE — Consult Note (Signed)
PATIENT NAME:  Joyce Horton, BOSKO MR#:  [id number] DATE OF BIRTH:  November 06, 1940  DATE OF CONSULTATION:  04/11/2012  REFERRING PHYSICIAN:  Thornton Park, MD CONSULTING PHYSICIAN:  Vivien Presto, MD  PRIMARY CARE PHYSICIAN: Fulton Reek, MD  REASON FOR CONSULTATION: Preop evaluation and medical management.   HISTORY OF PRESENT ILLNESS: The patient is a pleasant 74 year old Caucasian female with a history of breast cancer status post chemotherapy and radiation and currently in remission, hypertension and depression and anxiety who presents with a fall and subsequent right hip fracture who is admitted to the orthopedic service. Medicine is consulted for preop evaluation and medical management. The patient apparently was getting out of bed, walked around the bed to open the door and had a fall. The patient does know if she passed out initially but was found by her son and after she was moved to the bed had a 30 second episode or so of loss of consciousness or the patient does not remember. She does remember the fall. There is no chest pain or palpitations. The patient feels like the fall might have been mechanical as she might have tripped, however, she is not positive. Here she was noted to have angulated proximal right femoral diaphyseal fracture. The patient denies having any chest pain or palpitations. She has no history of myocardial infarction or heart failure or diabetes. The patient is undergoing some bereavement for her deceased husband. Her husband passed away on 01-22-2023 and was buried on Wednesday. She denies having any fevers or chills. She has chronic sinus drainage.   PAST MEDICAL HISTORY:  1. Breast cancer status post lumpectomy on the left as well as chemotherapy and radiation therapy, currently in remission.  2. Hypertension.  3. Depression.  4. Osteoporosis.  5. Anxiety.  6. Chronic sinus issues.   FAMILY HISTORY: Mom with breast cancer as well as colon cancer, bladder cancer, and  coronary artery disease.   SOCIAL HISTORY: The patient lives with her son. No tobacco. No drugs. Occasional alcohol.   ALLERGIES: No known drug allergies.   MEDICATIONS:  1. Aleve two tablets daily. 2. Acetaminophen/tramadol 325/37.5 mg 1 to 2 tabs every six hours as needed for headaches.  3. Alprazolam 0.25 mg 1 tab twice a day.  4. Aspirin 81 mg daily.  5. Bystolic 10 mg daily. Last took this a.m. 6. Calcium citrate plus vitamin D 1 tab daily.  7. Detrol LA 4 mg daily. 8. Fish oil - does not know the strength, once daily. 9. Fluoxetine 20 mg one tab daily.  10. HCTZ/lisinopril 25/20 mg one tab daily, last took this morning.  11. Multivitamin 2 tabs daily.  12. Prilosec OTC 20 mg one tab daily.  13. Cyclobenzaprine 10 mg every eight hours.  REVIEW OF SYSTEMS: CONSTITUTIONAL: No fever. Occasional chills. No fatigue. No weight changes. EYES: Has macular degeneration but has good peripheral vision. No glaucoma. ENT: No tinnitus. Has decreased hearing. Has chronic sinus drainage. RESPIRATORY: No cough. Occasional wheezing. No dyspnea on exertion or chronic obstructive pulmonary disease. CARDIOVASCULAR: No chest pain. Has chronic lower extremity edema. No arrhythmia. No dyspnea on exertion. No palpitations. GI: No nausea, vomiting, diarrhea, or abdominal pain. No melena or rectal bleeding. GENITOURINARY: No dysuria, frequency, or incontinence. ENDOCRINE: No polyuria, nocturia, or thyroid problems. HEME/LYMPH: No anemia or easy bruising. MUSCULOSKELETAL: Chronic arthritis. NEURO: No CVA or TIA. No numbness or weakness. PSYCHIATRIC: Has anxiety and depression.   PHYSICAL EXAMINATION:   VITAL SIGNS: Temperature 98.2, pulse 62, respiratory rate  18, blood pressure 150/81, and oxygen saturation 95% on room air.   GENERAL: The patient is a pleasant, obese Caucasian female lying in bed in no obvious distress.   HEENT: Normocephalic, atraumatic. Pupils are equal and reactive. Poor dentition.  Extraocular muscles intact. Moist mucous membranes.   NECK: Supple. No thyroid tenderness.   CARDIOVASCULAR: S1 and S2 regular rate and rhythm. No murmurs, rubs, or gallops.   LUNGS: Clear to auscultation without wheezing or crackles.   ABDOMEN: Soft, nontender, and nondistended. Positive bowel sounds in all quadrants.   EXTREMITIES: 1+ pitting edema, more on the left than the right, which the patient says is chronic. Healed lateral lower extremity below-the-knee scar on the left as well and some anterior tibial erythema.   NEUROLOGIC: Cranial nerves II through XII grossly intact. Strength is 5/5 in all extremities. I did not test the right lower extremity.   PSYCH: Awake, alert, and oriented x3. Pleasant and conversant.   SKIN: Some erythema on the anterior tibial region on the left, otherwise no other rashes.   LABORATORY, DIAGNOSTIC AND RADIOLOGIC DATA: Sodium 133, creatinine 0.89, BUN 15, glucose 119, chloride 100, albumin 3.3, total protein 6.1, AST 27, and ALT 21. WBC 7.6, hemoglobin 11.5, hematocrit 34.3, and platelets 372.   Urinalysis is not suggestive of infection.   EKG: Normal sinus rhythm at rate of 64, nonspecific ST and T wave changes. No acute ST elevations or depressions.   Right hip complete is showing an angulated proximal right femoral diaphyseal fracture just below the lesser trochanter. No dislocation. Joint space is maintained.   ASSESSMENT AND PLAN: We have a pleasant 74 year old Caucasian female with hypertension, depression, anxiety, breast cancer currently in remission, status post fall and possible brief syncopal episode who was found to have right hip fracture. At this point, she is admitted to the orthopedic service. The patient is without any history of myocardial infarction, congestive heart failure, coronary artery disease, or diabetes. She has good renal function. She has no active cardiac conditions and has no significant clinical risk factors as well. She  has about 4 mets and can ambulate to local stores and does some work around the house. Furthermore, she is going for an intermediate risk surgery. At this point, I would continue the Bystolic and she can proceed without further cardiac work-ups. She has no chest pain nor is short of breath. I would hold the ACE inhibitor as well as the hydrochlorothiazide. I would also hold Aleve at this point. I would continue the anxiety and depression medications. DVT prophylaxis per orthopedics once the patient is an acceptable bleeding risk. The syncopal episode lasted about 30 to 35 seconds, per her son who was there, and happened after the fall and hip fracture. I think that was likely secondary to pain. Currently she is neurologically intact and awake, alert, and oriented x3. I would order neuro checks frequently as well. I would continue a proton pump inhibitor.   CODE STATUS: FULL CODE. ____________________________ Vivien Presto, MD sa:slb D: 04/11/2012 21:09:32 ET T: 04/12/2012 10:28:35 ET JOB#: [id number]  cc: Vivien Presto, MD, <Dictator> Leonie Douglas. Doy Hutching, MD Timoteo Gaul, MD Karel Jarvis [facility] MD ELECTRONICALLY SIGNED 04/28/2012 12:22

## 2015-01-28 NOTE — H&P (Signed)
Subjective/Chief Complaint Right hip/thigh pain status post fall    History of Present Illness Patient is a 74 y/o female who recently lost her husband.  The funeral was yesterday.  Patient fell at home today while taking the telephone to her granddaughter.  She does not know exactly how her fall occurred.  She is unsure whether she tripped or had a syncopal episode.  Her son is at the bedside as are family friends.  She denies other areas of pain but was noted to have some bleeding over the right elbow which is not bandaged.   Past Med/Surgical Hx:  Migraines:   Hard of Hearing:   Peptic Ulcer Disease:   Gastric Reflux:   Anemia:   HTN:   left breast ca:   Lumpectomy:   Port Placement:   ALLERGIES:  No Known Allergies:   HOME MEDICATIONS: Medication Instructions Status  Aleve 220 mg oral tablet 3 tab(s) orally once a day for pain. Active  alprazolam 0.25 mg oral tablet 1 tab(s) orally 2 times a day as needed for anxiety. Active  aspirin 81 mg oral tablet 1 tab(s) orally once a day Active  Bystolic 10 mg oral tablet 1 tab(s) orally once a day Active  acetaminophen-tramadol 325 mg-37.5 mg oral tablet 1 to 2 tab(s) orally every 6 hours as needed for headaches.  Active  Detrol LA 4 mg oral capsule, extended release 1 cap(s) orally once a day Active  Fish Oil oral capsule 1 cap(s) orally once a day Active  fluoxetine 20 mg oral capsule 1 cap(s) orally once a day Active  hydrochlorothiazide-lisinopril 25 mg-20 mg oral tablet 1 tab(s) orally once a day Active  multivitamin 2 tab(s) orally once a day Active  PriLOSEC OTC 20 mg oral delayed release tablet 1 tab(s) orally once a day Active  Zyrtec 10 mg oral tablet 1 tab(s) orally once a day Active  cyclobenzaprine 10 mg oral tablet 1 tab(s) orally every 8 hours. Active   Review of Systems:   Subjective/Chief Complaint Right hip pain    Medications/Allergies Reviewed Medications/Allergies reviewed   Physical Exam:   GEN no acute  distress    HEENT PERRL, hearing intact to voice, moist oral mucosa, Oropharynx clear    NECK supple  No masses  trachea midline    RESP normal resp effort  clear BS  no use of accessory muscles    CARD regular rate  no murmur  No LE edema    EXTR Patient has Buck's traction on right leg.  She has intact motor and sensory function of the right toes and her toes are well perfused.    SKIN normal to palpation, Skin over right hip is intact.    NEURO motor/sensory function intact    PSYCH A+O to time, place, person   Lab Results: Hepatic:  07-Jul-13 17:51    Bilirubin, Total 0.3   Alkaline Phosphatase 76   SGPT (ALT) 21 (12-78 NOTE: NEW REFERENCE RANGE 08/29/2011)   SGOT (AST) 27   Total Protein, Serum  6.1   Albumin, Serum  3.3  Routine BB:  07-Jul-13 17:51    Crossmatch Unit 1 Ready   Crossmatch Unit 2 Ready (Result(s) reported on 11 Apr 2012 at 08:34PM.)   ABO Group + Rh Type O Negative   Antibody Screen NEGATIVE (Result(s) reported on 11 Apr 2012 at 08:07PM.)  Routine Chem:  07-Jul-13 17:51    Glucose, Serum  119   BUN 15   Creatinine (comp)  0.89   Sodium, Serum  133   Potassium, Serum 3.5   Chloride, Serum 100   CO2, Serum 25   Calcium (Total), Serum 8.6   Osmolality (calc) 268   eGFR (African American) >60   eGFR (Non-African American) >60 (eGFR values <40m/min/1.73 m2 may be an indication of chronic kidney disease (CKD). Calculated eGFR is useful in patients with stable renal function. The eGFR calculation will not be reliable in acutely ill patients when serum creatinine is changing rapidly. It is not useful in  patients on dialysis. The eGFR calculation may not be applicable to patients at the low and high extremes of body sizes, pregnant women, and vegetarians.)   Anion Gap 8  Routine UA:  07-Jul-13 17:51    Color (UA) Straw   Clarity (UA) Clear   Glucose (UA) Negative   Bilirubin (UA) Negative   Ketones (UA) Negative   Specific Gravity (UA)  1.008   Blood (UA) Negative   pH (UA) 7.0   Protein (UA) Negative   Nitrite (UA) Negative   Leukocyte Esterase (UA) Negative (Result(s) reported on 11 Apr 2012 at 06:46PM.)   RBC (UA) <1 /HPF   WBC (UA) <1 /HPF   Bacteria (UA) NONE SEEN   Epithelial Cells (UA) NONE SEEN  Result(s) reported on 11 Apr 2012 at 06:46PM.  Routine Hem:  07-Jul-13 17:51    WBC (CBC) 7.6   RBC (CBC) 3.92   Hemoglobin (CBC)  11.5   Hematocrit (CBC)  34.3   Platelet Count (CBC) 372 (Result(s) reported on 11 Apr 2012 at 07:14PM.)   MCV 88   MCH 29.3   MCHC 33.5   RDW  14.6     Assessment/Admission Diagnosis Right subtrochanteric femur fracture    Plan I have explained the diagnosis to the patient and her son.  She understands she has fractured her femur with significant displacement.  I shared the x-rays with her son, who is the power of attorney.  The risks and benefits of surgical intervention were discussed in detail with the patient and her son and they expressed understanding of the risks and benefits and agreed with plans for surgery.  The risks include, but are not limited to: infection, bleeding requiring transfusion, nerve and blood vessel injury malunion, nonunion, leg length discrepancy, change in lower extremity rotation, hardware failure, painful hardware, the need for more surgery, DVT, and PE, MI, stroke, pneumonia, respiratory failure and death.  I reviewed the risks and benefits of blood transfusion as well and the consent for blood transfusion was signed by her son at the patient's request.  The patient has been seen and cleared for surgery by the hospitalist.  She is NPO after midnight.  I answered all questions by the patient, her son and family friends.  The consent forms were signed by the patient's son at her request.   I have personally reviewed all labs and radiographs.  Plan is for surgery in the AM.   Electronic Signatures: KThornton Park(MD)  (Signed 07-Jul-13 21:33)  Authored: CHIEF  COMPLAINT and HISTORY, PAST MEDICAL/SURGIAL HISTORY, ALLERGIES, HOME MEDICATIONS, REVIEW OF SYSTEMS, PHYSICAL EXAM, LABS, ASSESSMENT AND PLAN   Last Updated: 07-Jul-13 21:33 by KThornton Park(MD)

## 2015-02-19 ENCOUNTER — Other Ambulatory Visit: Payer: Self-pay

## 2015-02-26 ENCOUNTER — Ambulatory Visit: Payer: Self-pay | Admitting: General Surgery

## 2015-03-22 ENCOUNTER — Encounter: Payer: Self-pay | Admitting: *Deleted

## 2015-06-29 ENCOUNTER — Other Ambulatory Visit: Payer: Self-pay | Admitting: *Deleted

## 2015-06-29 DIAGNOSIS — C50919 Malignant neoplasm of unspecified site of unspecified female breast: Secondary | ICD-10-CM

## 2015-07-02 ENCOUNTER — Inpatient Hospital Stay: Payer: Medicare Other

## 2015-07-02 ENCOUNTER — Inpatient Hospital Stay: Payer: Medicare Other | Admitting: Oncology

## 2015-07-09 ENCOUNTER — Inpatient Hospital Stay (HOSPITAL_BASED_OUTPATIENT_CLINIC_OR_DEPARTMENT_OTHER): Payer: Medicare Other | Admitting: Oncology

## 2015-07-09 ENCOUNTER — Inpatient Hospital Stay: Payer: Medicare Other | Attending: Oncology

## 2015-07-09 VITALS — BP 144/82 | HR 73 | Temp 99.6°F | Resp 18 | Wt 181.0 lb

## 2015-07-09 DIAGNOSIS — F0390 Unspecified dementia without behavioral disturbance: Secondary | ICD-10-CM | POA: Insufficient documentation

## 2015-07-09 DIAGNOSIS — Z79899 Other long term (current) drug therapy: Secondary | ICD-10-CM | POA: Diagnosis not present

## 2015-07-09 DIAGNOSIS — Z853 Personal history of malignant neoplasm of breast: Secondary | ICD-10-CM | POA: Diagnosis not present

## 2015-07-09 DIAGNOSIS — E669 Obesity, unspecified: Secondary | ICD-10-CM | POA: Insufficient documentation

## 2015-07-09 DIAGNOSIS — Z9221 Personal history of antineoplastic chemotherapy: Secondary | ICD-10-CM

## 2015-07-09 DIAGNOSIS — I10 Essential (primary) hypertension: Secondary | ICD-10-CM

## 2015-07-09 DIAGNOSIS — D649 Anemia, unspecified: Secondary | ICD-10-CM | POA: Diagnosis not present

## 2015-07-09 DIAGNOSIS — C50919 Malignant neoplasm of unspecified site of unspecified female breast: Secondary | ICD-10-CM

## 2015-07-09 DIAGNOSIS — Z7982 Long term (current) use of aspirin: Secondary | ICD-10-CM | POA: Diagnosis not present

## 2015-07-09 DIAGNOSIS — M818 Other osteoporosis without current pathological fracture: Secondary | ICD-10-CM | POA: Diagnosis not present

## 2015-07-09 DIAGNOSIS — K219 Gastro-esophageal reflux disease without esophagitis: Secondary | ICD-10-CM | POA: Insufficient documentation

## 2015-07-09 DIAGNOSIS — Z923 Personal history of irradiation: Secondary | ICD-10-CM | POA: Insufficient documentation

## 2015-07-09 DIAGNOSIS — C50112 Malignant neoplasm of central portion of left female breast: Secondary | ICD-10-CM

## 2015-07-09 NOTE — Progress Notes (Signed)
Son states neurologist dx patient with vascular dementia a few months ago.

## 2015-07-10 LAB — CANCER ANTIGEN 27.29: CA 27.29: 44.1 U/mL — ABNORMAL HIGH (ref 0.0–38.6)

## 2015-07-19 NOTE — Progress Notes (Signed)
[place]  [phone] [phone]) [phone]  ID: Joyce Horton OB: June 30, 1941  MR#: [id number]  QAS#:[id number]  Patient Care Team: Idelle Crouch, MD as PCP - General (Internal Medicine) Robert Bellow, MD (General Surgery) Estanislado Spire, MD (Family Medicine)  CHIEF COMPLAINT:  Chief Complaint  Patient presents with  . Follow-up    INTERVAL HISTORY: Patient returns to clinic today for laboratory work and routine six-month evaluation. Her son reports that her dementia and memory are slightly worse. She otherwise has been well and is asymptomatic. She denies any fevers, chills, or night sweats.  She has a good appetite and denies weight loss.  She denies any chest pain, shortness of breath, or hemoptysis.  She denies any nausea, vomiting, constipation, or diarrhea.  She has no urinary complaints.  Patient offers no further specific complaints today.  REVIEW OF SYSTEMS:   Review of Systems  Constitutional: Negative for fever and malaise/fatigue.  Respiratory: Negative.   Cardiovascular: Negative.   Gastrointestinal: Negative.   Musculoskeletal: Negative.   Neurological: Negative.  Negative for weakness.  Psychiatric/Behavioral: Positive for memory loss.    As per HPI. Otherwise, a complete review of systems is negatve.  PAST MEDICAL HISTORY: Past Medical History  Diagnosis Date  . Hypertension   . Ulcer   . Osteoporosis   . Obesity   . History of chemotherapy   . Anemia   . GERD (gastroesophageal reflux disease)   . Fall 2012  . 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 Neoadjuvant chemotherapy. Wide local excision, sentinel node biopsy completed May 2011 followed by whole breast radiation.    PAST SURGICAL HISTORY: Past Surgical History  Procedure Laterality Date  . Colonoscopy  2002,2007,2010    [facility]  . Breast biopsy  2000,2007,2010    left breast 2010  . Breast surgery Left 2011   lumpectomy  . Skin lesion excision  [id number]  . Stents      R leg  . Tibia fracture surgery Right July 2013    right femur fracture with internal fixation.    FAMILY HISTORY Family History  Problem Relation Age of Onset  . Cancer Mother     breast       ADVANCED DIRECTIVES:    HEALTH MAINTENANCE: Social History  Substance Use Topics  . Smoking status: Never Smoker   . Smokeless tobacco: Never Used  . Alcohol Use: 0.0 oz/week    0 Standard drinks or equivalent per week     Colonoscopy:  PAP:  Bone density:  Lipid panel:  Allergies  Allergen Reactions  . Fentanyl Other (See Comments)    Hallucinations and mental alterations    Current Outpatient Prescriptions  Medication Sig Dispense Refill  . acetaminophen (TYLENOL) 500 MG tablet Take 500 mg by mouth every 6 (six) hours as needed.    . ALPRAZolam (XANAX) 0.25 MG tablet Take 0.25 mg by mouth at bedtime as needed for anxiety.    [place] Kitchen aspirin 325 MG tablet Take 325 mg by mouth daily.    . cetirizine (ZYRTEC) 10 MG chewable tablet Chew 10 mg by mouth as needed for allergies.    Mar[place] Kitchen FLUoxetine (PROZAC) 20 MG tablet Take 20 mg by mouth daily.    Mar[place] Kitchen lisinopril-hydrochlorothiazide (PRINZIDE,ZESTORETIC) 20-25 MG per tablet Take 1 tablet by mouth daily.    . Multiple Vitamins-Minerals (MULTIVITAMIN WITH MINERALS) tablet Take 1 tablet by mouth daily.    . Nebivolol HCl (BYSTOLIC PO)  Take 10 mg by mouth daily.     Mar[place] Kitchen oxybutynin (DITROPAN) 5 MG tablet Take 5 mg by mouth 2 (two) times daily.    Mar[place] Kitchen aspirin 81 MG tablet Take 81 mg by mouth daily.     No current facility-administered medications for this visit.    OBJECTIVE: Filed Vitals:   07/09/15 1200  BP: 144/82  Pulse: 73  Temp:   Resp:      Body mass index is 35.35 kg/(m^2).    ECOG FS:0 - Asymptomatic  General: Well-developed, well-nourished, no acute distress. Eyes: Pink conjunctiva, anicteric sclera. Breasts: Bilateral breast and axilla without lumps or  masses. Lungs: Clear to auscultation bilaterally. Heart: Regular rate and rhythm. No rubs, murmurs, or gallops. Abdomen: Soft, nontender, nondistended. No organomegaly noted, normoactive bowel sounds. Musculoskeletal: No edema, cyanosis, or clubbing. Neuro: Alert, answering all questions appropriately. Cranial nerves grossly intact. Skin: No rashes or petechiae noted. Psych: Normal affect.   LAB RESULTS:  Lab Results  Component Value Date   NA 126* 12/28/2014   K 3.6 12/28/2014   CL 92* 12/28/2014   CO2 24 12/28/2014   GLUCOSE 94 12/28/2014   BUN 20 12/28/2014   CREATININE 0.82 12/28/2014   CALCIUM 8.7* 12/28/2014   PROT 6.6 12/28/2014   ALBUMIN 4.0 12/28/2014   AST 20 12/28/2014   ALT 12* 12/28/2014   ALKPHOS 62 12/28/2014   BILITOT 0.5 12/28/2014   GFRNONAA >60 12/28/2014   GFRAA >60 12/28/2014    Lab Results  Component Value Date   WBC 10.3 12/28/2014   NEUTROABS 7.0* 12/28/2014   HGB 11.3* 12/28/2014   HCT 35.9 12/28/2014   MCV 80 12/28/2014   PLT 449* 12/28/2014     STUDIES: No results found.  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  PLAN:    1.  Breast cancer: No evidence of disease. She completed her adjuvant XRT in August of 2011. Mammogram in October 2015 was reported as BI-RADS 2, repeat in the next several [place].  Patient's CA 27-29 continues to be elevated at 44.1, but essentially unchanged.  No intervention is needed at this time. Patient is now 5 years removed from completing her treatments and can be switched to yearly visits.  2. Memory loss/dementia: Unrelated to her underlying history of breast cancer. Monitor.  Patient expressed understanding and was in agreement with this plan. She also understands that She can call clinic at any time with any questions, concerns, or complaints.   Lloyd Huger, MD   07/19/2015 11:06 AM

## 2015-10-31 ENCOUNTER — Telehealth: Payer: Self-pay | Admitting: *Deleted

## 2015-10-31 ENCOUNTER — Other Ambulatory Visit: Payer: Self-pay | Admitting: Oncology

## 2015-10-31 DIAGNOSIS — C50919 Malignant neoplasm of unspecified site of unspecified female breast: Secondary | ICD-10-CM

## 2015-10-31 NOTE — Telephone Encounter (Signed)
Needs to have a mammo and needs order and appt made,please call with date and time

## 2015-10-31 NOTE — Telephone Encounter (Signed)
mammo order placed.  And yes, please reschedule in the next several [place].

## 2015-10-31 NOTE — Telephone Encounter (Signed)
It looks like patient was last seen in October 2016 with plans to have mammogram performed later that month, last mammogram 07/2014. According to your note follow up appointments should be yearly at this point. Would you like me to schedule patient for mammogram asap and follow up October 2017?

## 2015-11-16 ENCOUNTER — Other Ambulatory Visit: Payer: Medicare Other

## 2015-11-16 ENCOUNTER — Ambulatory Visit: Payer: Medicare Other | Attending: Oncology

## 2015-12-24 ENCOUNTER — Emergency Department: Payer: Medicare Other

## 2015-12-24 ENCOUNTER — Encounter: Payer: Self-pay | Admitting: Emergency Medicine

## 2015-12-24 ENCOUNTER — Inpatient Hospital Stay
Admission: EM | Admit: 2015-12-24 | Discharge: 2015-12-27 | DRG: 309 | Disposition: A | Discharge status: Home / Self Care | Payer: Medicare Other | Attending: Internal Medicine | Admitting: Internal Medicine

## 2015-12-24 DIAGNOSIS — F0390 Unspecified dementia without behavioral disturbance: Secondary | ICD-10-CM | POA: Diagnosis present

## 2015-12-24 DIAGNOSIS — E871 Hypo-osmolality and hyponatremia: Secondary | ICD-10-CM | POA: Diagnosis present

## 2015-12-24 DIAGNOSIS — E782 Mixed hyperlipidemia: Secondary | ICD-10-CM | POA: Diagnosis present

## 2015-12-24 DIAGNOSIS — I10 Essential (primary) hypertension: Secondary | ICD-10-CM | POA: Diagnosis present

## 2015-12-24 DIAGNOSIS — Z853 Personal history of malignant neoplasm of breast: Secondary | ICD-10-CM

## 2015-12-24 DIAGNOSIS — R42 Dizziness and giddiness: Secondary | ICD-10-CM | POA: Diagnosis present

## 2015-12-24 DIAGNOSIS — J209 Acute bronchitis, unspecified: Secondary | ICD-10-CM | POA: Diagnosis present

## 2015-12-24 DIAGNOSIS — K219 Gastro-esophageal reflux disease without esophagitis: Secondary | ICD-10-CM | POA: Diagnosis present

## 2015-12-24 DIAGNOSIS — Z79899 Other long term (current) drug therapy: Secondary | ICD-10-CM

## 2015-12-24 DIAGNOSIS — M810 Age-related osteoporosis without current pathological fracture: Secondary | ICD-10-CM | POA: Diagnosis present

## 2015-12-24 DIAGNOSIS — Z9221 Personal history of antineoplastic chemotherapy: Secondary | ICD-10-CM | POA: Diagnosis not present

## 2015-12-24 DIAGNOSIS — R000 Tachycardia, unspecified: Secondary | ICD-10-CM | POA: Diagnosis present

## 2015-12-24 DIAGNOSIS — E876 Hypokalemia: Secondary | ICD-10-CM | POA: Diagnosis present

## 2015-12-24 DIAGNOSIS — I4891 Unspecified atrial fibrillation: Secondary | ICD-10-CM | POA: Diagnosis present

## 2015-12-24 DIAGNOSIS — Z885 Allergy status to narcotic agent status: Secondary | ICD-10-CM | POA: Diagnosis not present

## 2015-12-24 DIAGNOSIS — E669 Obesity, unspecified: Secondary | ICD-10-CM | POA: Diagnosis present

## 2015-12-24 DIAGNOSIS — R002 Palpitations: Secondary | ICD-10-CM | POA: Diagnosis present

## 2015-12-24 DIAGNOSIS — Z6834 Body mass index (BMI) 34.0-34.9, adult: Secondary | ICD-10-CM | POA: Diagnosis not present

## 2015-12-24 DIAGNOSIS — Z7982 Long term (current) use of aspirin: Secondary | ICD-10-CM

## 2015-12-24 DIAGNOSIS — Z8249 Family history of ischemic heart disease and other diseases of the circulatory system: Secondary | ICD-10-CM

## 2015-12-24 LAB — BASIC METABOLIC PANEL
Anion gap: 10 (ref 5–15)
BUN: 28 mg/dL — AB (ref 6–20)
CALCIUM: 9.1 mg/dL (ref 8.9–10.3)
CO2: 24 mmol/L (ref 22–32)
CREATININE: 0.93 mg/dL (ref 0.44–1.00)
Chloride: 96 mmol/L — ABNORMAL LOW (ref 101–111)
GFR calc Af Amer: >60 mL/min (ref >60)
GFR calc non Af Amer: 58 mL/min — ABNORMAL LOW (ref >60)
GLUCOSE: 120 mg/dL — AB (ref 65–99)
Potassium: 2.6 mmol/L — CL (ref 3.5–5.1)
Sodium: 130 mmol/L — ABNORMAL LOW (ref 135–145)

## 2015-12-24 LAB — CBC
HCT: 30 % — ABNORMAL LOW (ref 35.0–47.0)
Hemoglobin: 9.5 g/dL — ABNORMAL LOW (ref 12.0–16.0)
MCH: 24.7 pg — AB (ref 26.0–34.0)
MCHC: 31.8 g/dL — AB (ref 32.0–36.0)
MCV: 77.7 fL — ABNORMAL LOW (ref 80.0–100.0)
PLATELETS: 470 10*3/uL — AB (ref 150–440)
RBC: 3.87 MIL/uL (ref 3.80–5.20)
RDW: 17.7 % — ABNORMAL HIGH (ref 11.5–14.5)
WBC: 11.3 10*3/uL — ABNORMAL HIGH (ref 3.6–11.0)

## 2015-12-24 LAB — PROTIME-INR
INR: 1.16
Prothrombin Time: 15 seconds (ref 11.4–15.0)

## 2015-12-24 LAB — POTASSIUM: Potassium: 3.5 mmol/L (ref 3.5–5.1)

## 2015-12-24 LAB — GLUCOSE, CAPILLARY: GLUCOSE-CAPILLARY: 111 mg/dL — AB (ref 65–99)

## 2015-12-24 LAB — BRAIN NATRIURETIC PEPTIDE: B NATRIURETIC PEPTIDE 5: 768 pg/mL — AB (ref 0.0–100.0)

## 2015-12-24 LAB — MRSA PCR SCREENING: MRSA by PCR: NEGATIVE

## 2015-12-24 LAB — MAGNESIUM: Magnesium: 1.5 mg/dL — ABNORMAL LOW (ref 1.7–2.4)

## 2015-12-24 LAB — TROPONIN I

## 2015-12-24 LAB — APTT: aPTT: 25 seconds (ref 24–36)

## 2015-12-24 LAB — TSH: TSH: 3.091 u[IU]/mL (ref 0.350–4.500)

## 2015-12-24 MED ORDER — DONEPEZIL HCL 5 MG PO TABS
10.0000 mg | ORAL_TABLET | Freq: Every day | ORAL | Status: DC
  Administered 2015-12-24 – 2015-12-26 (×3): 10 mg via ORAL
  Filled 2015-12-24 (×4): qty 2

## 2015-12-24 MED ORDER — NAPROXEN 500 MG PO TABS: 250.0000 mg | ORAL_TABLET | Freq: Two times a day (BID) | ORAL | Status: DC | PRN

## 2015-12-24 MED ORDER — SODIUM CHLORIDE 0.9 % IV SOLN: 250.0000 mL | INTRAVENOUS | Status: DC | PRN

## 2015-12-24 MED ORDER — BISACODYL 5 MG PO TBEC
5.0000 mg | DELAYED_RELEASE_TABLET | Freq: Every day | ORAL | Status: DC | PRN
  Filled 2015-12-24: qty 1

## 2015-12-24 MED ORDER — ASPIRIN 325 MG PO TABS
325.0000 mg | ORAL_TABLET | Freq: Every day | ORAL | Status: DC
  Administered 2015-12-24 – 2015-12-27 (×4): 325 mg via ORAL
  Filled 2015-12-24 (×4): qty 1

## 2015-12-24 MED ORDER — DILTIAZEM HCL 100 MG IV SOLR
5.0000 mg/h | Freq: Once | INTRAVENOUS | Status: DC
  Filled 2015-12-24: qty 100

## 2015-12-24 MED ORDER — FLUOXETINE HCL 20 MG PO CAPS
20.0000 mg | ORAL_CAPSULE | Freq: Every day | ORAL | Status: DC
Start: 2015-12-25 — End: 2015-12-27
  Administered 2015-12-25 – 2015-12-27 (×3): 20 mg via ORAL
  Filled 2015-12-24 (×3): qty 1

## 2015-12-24 MED ORDER — SODIUM CHLORIDE 0.9% FLUSH
3.0000 mL | Freq: Two times a day (BID) | INTRAVENOUS | Status: DC
  Administered 2015-12-24 – 2015-12-27 (×6): 3 mL via INTRAVENOUS

## 2015-12-24 MED ORDER — ACETAMINOPHEN 650 MG RE SUPP: 650.0000 mg | Freq: Four times a day (QID) | RECTAL | Status: DC | PRN

## 2015-12-24 MED ORDER — DILTIAZEM HCL 100 MG IV SOLR
5.0000 mg/h | INTRAVENOUS | Status: DC
  Administered 2015-12-24: 10 mg/h via INTRAVENOUS
  Filled 2015-12-24: qty 100

## 2015-12-24 MED ORDER — POLYETHYLENE GLYCOL 3350 17 G PO PACK: 17.0000 g | PACK | Freq: Every day | ORAL | Status: DC | PRN

## 2015-12-24 MED ORDER — METOPROLOL TARTRATE 50 MG PO TABS
50.0000 mg | ORAL_TABLET | Freq: Two times a day (BID) | ORAL | Status: DC
  Administered 2015-12-24 (×2): 50 mg via ORAL
  Filled 2015-12-24 (×2): qty 1

## 2015-12-24 MED ORDER — DILTIAZEM HCL 100 MG IV SOLR
5.0000 mg/h | Freq: Once | INTRAVENOUS | Status: AC
  Administered 2015-12-24: 5 mg/h via INTRAVENOUS
  Filled 2015-12-24 (×2): qty 100

## 2015-12-24 MED ORDER — SODIUM CHLORIDE 0.9% FLUSH
3.0000 mL | Freq: Two times a day (BID) | INTRAVENOUS | Status: DC
  Administered 2015-12-24 – 2015-12-26 (×4): 3 mL via INTRAVENOUS

## 2015-12-24 MED ORDER — HEPARIN BOLUS VIA INFUSION
3250.0000 [IU] | Freq: Once | INTRAVENOUS | Status: AC
  Administered 2015-12-24: 3250 [IU] via INTRAVENOUS
  Filled 2015-12-24: qty 3250

## 2015-12-24 MED ORDER — ALPRAZOLAM 0.25 MG PO TABS
0.2500 mg | ORAL_TABLET | Freq: Every evening | ORAL | Status: DC | PRN
  Administered 2015-12-26: 0.25 mg via ORAL
  Filled 2015-12-24: qty 1

## 2015-12-24 MED ORDER — POTASSIUM CHLORIDE CRYS ER 20 MEQ PO TBCR: 40.0000 meq | EXTENDED_RELEASE_TABLET | ORAL | Status: DC

## 2015-12-24 MED ORDER — HEPARIN (PORCINE) IN NACL 100-0.45 UNIT/ML-% IJ SOLN
900.0000 [IU]/h | INTRAMUSCULAR | Status: DC
  Administered 2015-12-24: 900 [IU]/h via INTRAVENOUS
  Filled 2015-12-24 (×2): qty 250

## 2015-12-24 MED ORDER — ONDANSETRON HCL 4 MG PO TABS: 4.0000 mg | ORAL_TABLET | Freq: Four times a day (QID) | ORAL | Status: DC | PRN

## 2015-12-24 MED ORDER — ONDANSETRON HCL 4 MG/2ML IJ SOLN: 4.0000 mg | Freq: Four times a day (QID) | INTRAMUSCULAR | Status: DC | PRN

## 2015-12-24 MED ORDER — POTASSIUM CHLORIDE CRYS ER 20 MEQ PO TBCR
40.0000 meq | EXTENDED_RELEASE_TABLET | Freq: Once | ORAL | Status: AC
  Administered 2015-12-24: 40 meq via ORAL
  Filled 2015-12-24: qty 2

## 2015-12-24 MED ORDER — ACETAMINOPHEN 325 MG PO TABS: 650.0000 mg | ORAL_TABLET | Freq: Four times a day (QID) | ORAL | Status: DC | PRN

## 2015-12-24 MED ORDER — IPRATROPIUM-ALBUTEROL 0.5-2.5 (3) MG/3ML IN SOLN: 3.0000 mL | Freq: Four times a day (QID) | RESPIRATORY_TRACT | Status: DC | PRN

## 2015-12-24 MED ORDER — POTASSIUM CHLORIDE 10 MEQ/100ML IV SOLN
10.0000 meq | INTRAVENOUS | Status: AC
  Administered 2015-12-24 (×4): 10 meq via INTRAVENOUS
  Filled 2015-12-24 (×4): qty 100

## 2015-12-24 MED ORDER — SODIUM CHLORIDE 0.9% FLUSH: 3.0000 mL | INTRAVENOUS | Status: DC | PRN

## 2015-12-24 MED ORDER — DILTIAZEM HCL 25 MG/5ML IV SOLN
10.0000 mg | Freq: Once | INTRAVENOUS | Status: AC
  Administered 2015-12-24: 10 mg via INTRAVENOUS
  Filled 2015-12-24: qty 5

## 2015-12-24 MED ORDER — FUROSEMIDE 10 MG/ML IJ SOLN
20.0000 mg | Freq: Every day | INTRAMUSCULAR | Status: DC
  Administered 2015-12-24 – 2015-12-25 (×2): 20 mg via INTRAVENOUS
  Filled 2015-12-24 (×2): qty 2

## 2015-12-24 MED ORDER — OXYBUTYNIN CHLORIDE 5 MG PO TABS
5.0000 mg | ORAL_TABLET | Freq: Two times a day (BID) | ORAL | Status: DC
  Administered 2015-12-24 – 2015-12-27 (×6): 5 mg via ORAL
  Filled 2015-12-24 (×6): qty 1

## 2015-12-24 MED ORDER — MAGNESIUM OXIDE 400 (241.3 MG) MG PO TABS
400.0000 mg | ORAL_TABLET | Freq: Every day | ORAL | Status: AC
Start: 2015-12-24 — End: 2015-12-25
  Administered 2015-12-24 – 2015-12-25 (×2): 400 mg via ORAL
  Filled 2015-12-24 (×2): qty 1

## 2015-12-24 MED ORDER — NAPROXEN SODIUM 220 MG PO TABS: 220.0000 mg | ORAL_TABLET | Freq: Two times a day (BID) | ORAL | Status: DC | PRN

## 2015-12-24 MED ORDER — MAGNESIUM SULFATE 2 GM/50ML IV SOLN
2.0000 g | Freq: Once | INTRAVENOUS | Status: AC
  Administered 2015-12-24: 2 g via INTRAVENOUS
  Filled 2015-12-24: qty 50

## 2015-12-24 NOTE — ED Notes (Signed)
Unsuccessful IV attempts X 2 left arm.

## 2015-12-24 NOTE — Progress Notes (Signed)
ANTICOAGULATION CONSULT NOTE - Initial Consult  Pharmacy Consult for heparin drip Indication: atrial fibrillation  Allergies  Allergen Reactions  . Fentanyl Other (See Comments)    Hallucinations and mental alterations   Patient Measurements: Height: 5' (152.4 cm) Weight: 183 lb 9.6 oz (83.28 kg) IBW/kg (Calculated) : 45.5 Heparin Dosing Weight: 65 kg  Vital Signs: Temp: 98.6 F (37 C) (03/20 1135) Temp Source: Oral (03/20 1135) BP: 115/95 mmHg (03/20 1318) Pulse Rate: 119 (03/20 1318)  Labs:  Recent Labs  12/24/15 1158  HGB 9.5*  HCT 30.0*  PLT 470*  CREATININE 0.93  TROPONINI <0.03    Estimated Creatinine Clearance: 49.2 mL/min (by C-G formula based on Cr of 0.93).  Medical History: Past Medical History  Diagnosis Date  . Hypertension   . Ulcer   . Osteoporosis   . Obesity   . History of chemotherapy   . Anemia   . GERD (gastroesophageal reflux disease)   . Fall 2012  . 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 Neoadjuvant chemotherapy. Wide local excision, sentinel node biopsy completed May 2011 followed by whole breast radiation.   Assessment: Pharmacy consulted to monitor and dose heparin drip for this patient admitted with newly diagnosed atrial fibrillation. Patients heparin dosing weight is 65 kg. Patient was not taking anticoagulants prior to admission per med rec.   Baseline labs: Hgb 9.5 Plt 470  APTT & protime/INR ordered STAT  Goal of Therapy:  Heparin level 0.3-0.7 units/ml Monitor platelets by anticoagulation protocol: Yes   Plan:  Give 3250 units bolus x 1 (50 units/kg) Start heparin infusion at 900 units/hr (~14 units/kg/hr) Check anti-Xa level in 8 hours and daily while on heparin Continue to monitor H&H and platelets  Lenis Noon, PharmD Clinical Pharmacist 12/24/2015,3:44 PM

## 2015-12-24 NOTE — ED Notes (Addendum)
Pt to ed with c/o sob, dizziness  and rapid heartrate x 1 week.  Pt brought from dr sparks office.

## 2015-12-24 NOTE — H&P (Signed)
[place] [facility] [place] NAME: Joyce Horton    MR#:  [id number]  DATE OF BIRTH:  [birth year]  DATE OF ADMISSION:  12/24/2015  PRIMARY CARE PHYSICIAN: SPARKS,JEFFREY D, MD   REQUESTING/REFERRING PHYSICIAN: Dr. Corky Downs  CHIEF COMPLAINT:   Chief Complaint  Patient presents with  . Shortness of Breath    HISTORY OF PRESENT ILLNESS:  Joyce Horton  is a 75 y.o. female with a known history of Hypertension, dementia, obesity presents to the emergency room with 1 week off tachycardia. Patient has noticed dizziness, weakness and shortness of breath for a week. Son checked her heart rate at home with her blood pressure machine and heart rate has been fluctuating between 120-155. She did not want to come to the emergency room and today she had a routine follow-up with her primary care physician and has been sent to the emergency room after heart rate was noticed to be 155 with atrial fibrillation. Here patient has been given a Cardizem bolus in spite of which a heartrate is elevated into the 140s and the hospitalist team has been consult. No history of atrial fibrillation. Patient is on Toprol-XL 50 mg daily for her hypertension. No melena or stool in the blood. No hematuria. No recent falls. Not an anticoagulation at home. No thyroid problems.  PAST MEDICAL HISTORY:   Past Medical History  Diagnosis Date  . Hypertension   . Ulcer   . Osteoporosis   . Obesity   . History of chemotherapy   . Anemia   . GERD (gastroesophageal reflux disease)   . Fall 2012  . 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 Neoadjuvant chemotherapy. Wide local excision, sentinel node biopsy completed May 2011 followed by whole breast radiation.    PAST SURGICAL HISTORY:   Past Surgical History  Procedure Laterality Date  . Colonoscopy  2002,2007,2010    [place]  . Breast biopsy  2000,2007,2010    left breast 2010  . Breast  surgery Left 2011    lumpectomy  . Skin lesion excision  [id number]  . Stents      R leg  . Tibia fracture surgery Right July 2013    right femur fracture with internal fixation.    SOCIAL HISTORY:   Social History  Substance Use Topics  . Smoking status: Never Smoker   . Smokeless tobacco: Never Used  . Alcohol Use: 0.0 oz/week    0 Standard drinks or equivalent per week    FAMILY HISTORY:   Family History  Problem Relation Age of Onset  . Cancer Mother     breast  . CAD Father     DRUG ALLERGIES:   Allergies  Allergen Reactions  . Fentanyl Other (See Comments)    Hallucinations and mental alterations    REVIEW OF SYSTEMS:   Review of Systems  Constitutional: Positive for malaise/fatigue. Negative for fever, chills and weight loss.  HENT: Negative for hearing loss and nosebleeds.   Eyes: Negative for blurred vision, double vision and pain.  Respiratory: Positive for shortness of breath. Negative for cough, hemoptysis, sputum production and wheezing.   Cardiovascular: Positive for palpitations and leg swelling. Negative for chest pain and orthopnea.  Gastrointestinal: Negative for nausea, vomiting, abdominal pain, diarrhea and constipation.  Genitourinary: Negative for dysuria and hematuria.  Musculoskeletal: Negative for myalgias, back pain and falls.  Skin: Negative for rash.  Neurological: Positive for dizziness and weakness. Negative for  tremors, sensory change, speech change, focal weakness, seizures and headaches.  Endo/Heme/Allergies: Does not bruise/bleed easily.  Psychiatric/Behavioral: Positive for memory loss. Negative for depression. The patient is not nervous/anxious.     MEDICATIONS AT HOME:   Prior to Admission medications   Medication Sig Start Date End Date Taking? Authorizing Provider  acetaminophen (TYLENOL) 500 MG tablet Take 500 mg by mouth every 6 (six) hours as needed.   Yes Historical Provider, MD  ALPRAZolam Duanne Moron) 0.25 MG tablet Take  0.25 mg by mouth at bedtime as needed for anxiety.   Yes Historical Provider, MD  aspirin 325 MG tablet Take 325 mg by mouth daily.   Yes Historical Provider, MD  Calcium-Vitamin D (CALTRATE 600 PLUS-VIT D PO) Take 1 tablet by mouth daily.    Yes Historical Provider, MD  cetirizine (ZYRTEC) 10 MG chewable tablet Chew 10 mg by mouth as needed for allergies.   Yes Historical Provider, MD  donepezil (ARICEPT) 10 MG tablet Take 10 mg by mouth at bedtime.   Yes Historical Provider, MD  FLUoxetine (PROZAC) 20 MG tablet Take 20 mg by mouth daily.   Yes Historical Provider, MD  lisinopril-hydrochlorothiazide (PRINZIDE,ZESTORETIC) 20-25 MG per tablet Take 1 tablet by mouth daily.   Yes Historical Provider, MD  metoprolol succinate (TOPROL-XL) 50 MG 24 hr tablet Take 50 mg by mouth daily.    Yes Historical Provider, MD  Multiple Vitamins-Minerals (MULTIVITAMIN WITH MINERALS) tablet Take 1 tablet by mouth daily.   Yes Historical Provider, MD  naproxen sodium (ANAPROX) 220 MG tablet Take 220 mg by mouth 2 (two) times daily as needed.   Yes Historical Provider, MD  Nebivolol HCl (BYSTOLIC PO) Take 10 mg by mouth daily.    Yes Historical Provider, MD  oxybutynin (DITROPAN) 5 MG tablet Take 5 mg by mouth 2 (two) times daily.   Yes Historical Provider, MD     VITAL SIGNS:  Blood pressure 115/95, pulse 119, temperature 98.6 F (37 C), temperature source Oral, resp. rate 24, height 5' (1.524 m), SpO2 98 %.  PHYSICAL EXAMINATION:  Physical Exam  GENERAL:  75 y.o.-year-old patient lying in the bed, Anxious EYES: Pupils equal, round, reactive to light and accommodation. No scleral icterus. Extraocular muscles intact.  HEENT: Head atraumatic, normocephalic. Oropharynx and nasopharynx clear. No oropharyngeal erythema, moist oral mucosa  NECK:  Supple, no jugular venous distention. No thyroid enlargement, no tenderness.  LUNGS: Normal breath sounds bilaterally, no wheezing, rales, rhonchi. No use of accessory  muscles of respiration.  CARDIOVASCULAR: S1, S2 . No murmurs, rubs, or gallops. Irregularly irregular and tachycardic ABDOMEN: Soft, nontender, nondistended. Bowel sounds present. No organomegaly or mass.  EXTREMITIES: No pedal edema, cyanosis, or clubbing. + 2 pedal & radial pulses b/l.   NEUROLOGIC: Cranial nerves II through XII are intact. No focal Motor or sensory deficits appreciated b/l PSYCHIATRIC: The patient is alert and awake and pleasant. Anxious SKIN: No obvious rash, lesion, or ulcer.   LABORATORY PANEL:   CBC  Recent Labs Lab 12/24/15 1158  WBC 11.3*  HGB 9.5*  HCT 30.0*  PLT 470*   ------------------------------------------------------------------------------------------------------------------  Chemistries   Recent Labs Lab 12/24/15 1158  NA 130*  K 2.6*  CL 96*  CO2 24  GLUCOSE 120*  BUN 28*  CREATININE 0.93  CALCIUM 9.1   ------------------------------------------------------------------------------------------------------------------  Cardiac Enzymes  Recent Labs Lab 12/24/15 1158  TROPONINI <0.03   ------------------------------------------------------------------------------------------------------------------  RADIOLOGY:  Dg Chest 2 View  12/24/2015  CLINICAL DATA:  Short of breath.  Atrial fibrillation EXAM: CHEST  2 VIEW COMPARISON:  04/11/2012 FINDINGS: Cardiac enlargement. Negative for heart failure. Negative for infiltrate effusion or mass. Moderate to severe compression fracture in the lower thoracic spine, not present 02/25/2010. Indeterminate age. IMPRESSION: No active cardiopulmonary disease. Electronically Signed   By: Franchot Gallo M.D.   On: 12/24/2015 12:45     IMPRESSION AND PLAN:   * Atrial fibrillation with rapid ventricular rate, new onset Son has noticed tachycardia with a blood pressure machine at home for one week. Heart rate has fluctuated between 120-155 We'll start Cardizem drip as patient has not responded to  Cardizem initial IV bolus. Start heparin drip. Consult cardiology. Check TSH and echocardiogram. Repeat troponin level Replace potassium and check magnesium levels. Increase metoprolol to 50 mg twice a day.  * Hypokalemia Replace orally and through IV. Check magnesium level.  * Hypertension Lisinopril-hydrochlorothiazide as patient is on a Cardizem drip and we're also increasing dose on the metoprolol.  * Dementia Watch for inpatient delirium  * Mild hyponatremia. Patient has had mild hyponatremia in the past. The to be monitored.  * DVT prophylaxis. Patient is on heparin drip.  All the records are reviewed and case discussed with ED provider. Management plans discussed with the patient, family and they are in agreement.  CODE STATUS: FULL CODE  TOTAL TIME TAKING CARE OF THIS PATIENT: 40 minutes.   Hillary Bow R M.D on 12/24/2015 at 3:19 PM  Between 7am to 6pm - Pager - [phone]  After 6pm go to [email or web address] - password [email or web address] [facility]  Office  [phone]  CC: Primary care physician; Idelle Crouch, MD  Note: This dictation was prepared with Dragon dictation along with smaller phrase technology. Any transcriptional errors that result from this process are unintentional.

## 2015-12-24 NOTE — ED Provider Notes (Signed)
[facility] Emergency Department Provider Note  ____________________________________________    I have reviewed the triage vital signs and the nursing notes.   HISTORY  Chief Complaint Shortness of Breath    HPI Joyce Horton is a 75 y.o. female who presents with complaints of shortness of breath, dizziness, fatigue and intermittent palpitations. Patient is coming from her PCP office where was discovered that she had atrial fibrillation on EKG which is new. Patient denies chest pain. No fevers or chills. No recent travel. No calf pain or swelling.     Past Medical History  Diagnosis Date  . Hypertension   . Ulcer   . Osteoporosis   . Obesity   . History of chemotherapy   . Anemia   . GERD (gastroesophageal reflux disease)   . Fall 2012  . 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 Neoadjuvant chemotherapy. Wide local excision, sentinel node biopsy completed May 2011 followed by whole breast radiation.    Patient Active Problem List   Diagnosis Date Noted  . Fat necrosis (segmental) of breast 08/18/2014  . Breast cancer ([place]) 08/19/2013    Past Surgical History  Procedure Laterality Date  . Colonoscopy  2002,2007,2010    [place]  . Breast biopsy  2000,2007,2010    left breast 2010  . Breast surgery Left 2011    lumpectomy  . Skin lesion excision  [id number]  . Stents      R leg  . Tibia fracture surgery Right July 2013    right femur fracture with internal fixation.    Current Outpatient Rx  Name  Route  Sig  Dispense  Refill  . acetaminophen (TYLENOL) 500 MG tablet   Oral   Take 500 mg by mouth every 6 (six) hours as needed.         . ALPRAZolam (XANAX) 0.25 MG tablet   Oral   Take 0.25 mg by mouth at bedtime as needed for anxiety.         Marland Kitchen aspirin 325 MG tablet   Oral   Take 325 mg by mouth daily.         . Calcium-Vitamin D (CALTRATE 600 PLUS-VIT D PO)   Oral   Take 1  tablet by mouth daily.          . cetirizine (ZYRTEC) 10 MG chewable tablet   Oral   Chew 10 mg by mouth as needed for allergies.         Marland Kitchen donepezil (ARICEPT) 10 MG tablet   Oral   Take 10 mg by mouth at bedtime.      12   . FLUoxetine (PROZAC) 20 MG tablet   Oral   Take 20 mg by mouth daily.         Marland Kitchen lisinopril-hydrochlorothiazide (PRINZIDE,ZESTORETIC) 20-25 MG per tablet   Oral   Take 1 tablet by mouth daily.         . metoprolol succinate (TOPROL-XL) 50 MG 24 hr tablet   Oral   Take 50 mg by mouth daily.       11   . Multiple Vitamins-Minerals (MULTIVITAMIN WITH MINERALS) tablet   Oral   Take 1 tablet by mouth daily.         . naproxen sodium (ANAPROX) 220 MG tablet   Oral   Take 220 mg by mouth 2 (two) times daily as needed.         . Nebivolol HCl (BYSTOLIC  PO)   Oral   Take 10 mg by mouth daily.          Marland Kitchen oxybutynin (DITROPAN) 5 MG tablet   Oral   Take 5 mg by mouth 2 (two) times daily.           Allergies Fentanyl  Family History  Problem Relation Age of Onset  . Cancer Mother     breast    Social History Social History  Substance Use Topics  . Smoking status: Never Smoker   . Smokeless tobacco: Never Used  . Alcohol Use: 0.0 oz/week    0 Standard drinks or equivalent per week    Review of Systems  Constitutional: Negative for fever. Eyes: Negative for redness ENT: Negative for sore throat Cardiovascular: Negative for chest pain Respiratory: Mild shortness of breath Gastrointestinal: Negative for abdominal pain Genitourinary: Negative for dysuria. Musculoskeletal: Negative for back pain. Skin: Negative for rash. Neurological: Negative for focal weakness. Occasional dizziness Psychiatric: Mild anxiety  ____________________________________________   PHYSICAL EXAM:  VITAL SIGNS: ED Triage Vitals  Enc Vitals Group     BP 12/24/15 1135 127/94 mmHg     Pulse Rate 12/24/15 1135 121     Resp 12/24/15 1135 26      Temp 12/24/15 1135 98.6 F (37 C)     Temp Source 12/24/15 1135 Oral     SpO2 12/24/15 1135 99 %     Weight --      Height 12/24/15 1135 5' (1.524 m)     Head Cir --      Peak Flow --      Pain Score 12/24/15 1132 0     Pain Loc --      Pain Edu? --      Excl. in [place]? --      Constitutional: Alert and oriented. Well appearing and in no distress. Pleasant and interactive Eyes: Conjunctivae are normal. No erythema or injection ENT   Head: Normocephalic and atraumatic.   Mouth/Throat: Mucous membranes are moist. Cardiovascular: Tachycardia, irregularly irregular rhythm Normal and symmetric distal pulses are present in the upper extremities. No murmurs or rubs  Respiratory: Normal respiratory effort without tachypnea nor retractions. Breath sounds are clear and equal bilaterally.  Gastrointestinal: Soft and non-tender in all quadrants. No distention. There is no CVA tenderness. Genitourinary: deferred Musculoskeletal: Nontender with normal range of motion in all extremities. No lower extremity tenderness nor edema. Neurologic:  Normal speech and language. No gross focal neurologic deficits are appreciated. Skin:  Skin is warm, dry and intact. No rash noted. Psychiatric: Mood and affect are normal. Patient exhibits appropriate insight and judgment.  ____________________________________________    LABS (pertinent positives/negatives)  Labs Reviewed  BASIC METABOLIC PANEL - Abnormal; Notable for the following:    Sodium 130 (*)    Potassium 2.6 (*)    Chloride 96 (*)    Glucose, Bld 120 (*)    BUN 28 (*)    GFR calc non Af Amer 58 (*)    All other components within normal limits  CBC - Abnormal; Notable for the following:    WBC 11.3 (*)    Hemoglobin 9.5 (*)    HCT 30.0 (*)    MCV 77.7 (*)    MCH 24.7 (*)    MCHC 31.8 (*)    RDW 17.7 (*)    Platelets 470 (*)    All other components within normal limits  TROPONIN I     ____________________________________________   EKG ED  ECG REPORT I, Lavonia Drafts, the attending physician, personally viewed and interpreted this ECG.   Date: 12/24/2015  EKG Time: 11:35 AM  Rate: 131  Rhythm: Atrial fibrillation  Axis: Normal  Intervals:A. fib  ST&T Change: Nonspecific changes   ____________________________________________    RADIOLOGY  Chest x-ray is normal  ____________________________________________   PROCEDURES  Procedure(s) performed: none  Critical Care performed: yes CRITICAL CARE Performed by: Lavonia Drafts   Total critical care time: 30 minutes  Critical care time was exclusive of separately billable procedures and treating other patients.  Critical care was necessary to treat or prevent imminent or life-threatening deterioration.  Critical care was time spent personally by me on the following activities: development of treatment plan with patient and/or surrogate as well as nursing, discussions with consultants, evaluation of patient's response to treatment, examination of patient, obtaining history from patient or surrogate, ordering and performing treatments and interventions, ordering and review of laboratory studies, ordering and review of radiographic studies, pulse oximetry and re-evaluation of patient's condition.  ____________________________________________   INITIAL IMPRESSION / ASSESSMENT AND PLAN / ED COURSE  Pertinent labs & imaging results that were available during my care of the patient were reviewed by me and considered in my medical decision making (see chart for details).  Patient p/w afib with RVR. This is new for her. Mild sob and dizziness.   We will give Cardizem 10 mg IV --> good response but HR increased again soon after. IV cardizem gtt ordered.  Patient will be admitted to IM for further eval and mgmt  ____________________________________________   FINAL CLINICAL IMPRESSION(S) / ED  DIAGNOSES  Final diagnoses:  Atrial fibrillation with RVR (HCC)          Lavonia Drafts, MD 12/24/15 1710

## 2015-12-25 ENCOUNTER — Inpatient Hospital Stay: Admit: 2015-12-25 | Payer: Medicare Other

## 2015-12-25 LAB — BASIC METABOLIC PANEL
ANION GAP: 12 (ref 5–15)
Anion gap: 10 (ref 5–15)
BUN: 25 mg/dL — AB (ref 6–20)
BUN: 27 mg/dL — ABNORMAL HIGH (ref 6–20)
CALCIUM: 8.6 mg/dL — AB (ref 8.9–10.3)
CO2: 20 mmol/L — AB (ref 22–32)
CO2: 25 mmol/L (ref 22–32)
Calcium: 8.9 mg/dL (ref 8.9–10.3)
Chloride: 95 mmol/L — ABNORMAL LOW (ref 101–111)
Chloride: 97 mmol/L — ABNORMAL LOW (ref 101–111)
Creatinine, Ser: 0.8 mg/dL (ref 0.44–1.00)
Creatinine, Ser: 0.94 mg/dL (ref 0.44–1.00)
GFR calc Af Amer: >60 mL/min (ref >60)
GFR calc Af Amer: >60 mL/min (ref >60)
GFR calc non Af Amer: >60 mL/min (ref >60)
GFR calc non Af Amer: 57 mL/min — ABNORMAL LOW (ref >60)
GLUCOSE: 101 mg/dL — AB (ref 65–99)
Glucose, Bld: 130 mg/dL — ABNORMAL HIGH (ref 65–99)
Potassium: 3.1 mmol/L — ABNORMAL LOW (ref 3.5–5.1)
Potassium: 3.7 mmol/L (ref 3.5–5.1)
Sodium: 127 mmol/L — ABNORMAL LOW (ref 135–145)
Sodium: 132 mmol/L — ABNORMAL LOW (ref 135–145)

## 2015-12-25 LAB — TROPONIN I
Troponin I: <0.03 ng/mL (ref <0.031)
Troponin I: <0.03 ng/mL (ref <0.031)
Troponin I: <0.03 ng/mL (ref <0.031)

## 2015-12-25 LAB — CBC
HCT: 30.5 % — ABNORMAL LOW (ref 35.0–47.0)
Hemoglobin: 9.9 g/dL — ABNORMAL LOW (ref 12.0–16.0)
MCH: 24.9 pg — ABNORMAL LOW (ref 26.0–34.0)
MCHC: 32.3 g/dL (ref 32.0–36.0)
MCV: 77 fL — AB (ref 80.0–100.0)
PLATELETS: 450 10*3/uL — AB (ref 150–440)
RBC: 3.96 MIL/uL (ref 3.80–5.20)
RDW: 17.4 % — AB (ref 11.5–14.5)
WBC: 11.8 10*3/uL — AB (ref 3.6–11.0)

## 2015-12-25 LAB — MAGNESIUM: Magnesium: 1.7 mg/dL (ref 1.7–2.4)

## 2015-12-25 LAB — HEPARIN LEVEL (UNFRACTIONATED)
Heparin Unfractionated: 0.47 IU/mL (ref 0.30–0.70)
Heparin Unfractionated: 0.45 IU/mL (ref 0.30–0.70)

## 2015-12-25 MED ORDER — MAGNESIUM SULFATE 2 GM/50ML IV SOLN
2.0000 g | Freq: Once | INTRAVENOUS | Status: AC
  Administered 2015-12-25: 2 g via INTRAVENOUS
  Filled 2015-12-25: qty 50

## 2015-12-25 MED ORDER — METOPROLOL TARTRATE 100 MG PO TABS
100.0000 mg | ORAL_TABLET | Freq: Two times a day (BID) | ORAL | Status: DC
  Administered 2015-12-25 – 2015-12-27 (×5): 100 mg via ORAL
  Filled 2015-12-25 (×5): qty 1

## 2015-12-25 MED ORDER — POTASSIUM CHLORIDE 20 MEQ PO PACK
40.0000 meq | PACK | Freq: Once | ORAL | Status: AC
  Administered 2015-12-25: 40 meq via ORAL
  Filled 2015-12-25: qty 2

## 2015-12-25 MED ORDER — APIXABAN 5 MG PO TABS
5.0000 mg | ORAL_TABLET | Freq: Two times a day (BID) | ORAL | Status: DC
  Administered 2015-12-25 – 2015-12-27 (×5): 5 mg via ORAL
  Filled 2015-12-25 (×5): qty 1

## 2015-12-25 NOTE — Progress Notes (Signed)
[facility] [place] NAME: Joyce Horton    MR#:  [id number]  DATE OF BIRTH:  1941/04/19  SUBJECTIVE:  CHIEF COMPLAINT:   Chief Complaint  Patient presents with  . Shortness of Breath   patient is 75 year old Caucasian female with past medical history significant for history of essential hypertension, dementia, obesity who presents to the hospital with 1 week history of tachycardia, dizziness, weakness, shortness of breath. In emergency room, she was noted to be in A. fib, RVR, rate of 120-150. Patient was initiated on Cardizem IV drip. Metoprolol was initiated orally. Patient denies any chest pains prior to coming to the hospital. She feels good today  Review of Systems  Unable to perform ROS: dementia    VITAL SIGNS: Blood pressure 91/41, pulse 119, temperature 97.6 F (36.4 C), temperature source Oral, resp. rate 20, height 5' (1.524 m), weight 80.3 kg (177 lb 0.5 oz), SpO2 100 %.  PHYSICAL EXAMINATION:   GENERAL:  75 y.o.-year-old patient lying in the bed with no acute distress. Some somnolent EYES: Pupils equal, round, reactive to light and accommodation. No scleral icterus. Extraocular muscles intact.  HEENT: Head atraumatic, normocephalic. Oropharynx and nasopharynx clear.  NECK:  Supple, no jugular venous distention. No thyroid enlargement, no tenderness.  LUNGS: Normal breath sounds bilaterally, no wheezing, rales,rhonchi or crepitation. No use of accessory muscles of respiration.  CARDIOVASCULAR: S1, S2 irregularly irregular . No murmurs, rubs, or gallops.  ABDOMEN: Soft, nontender, nondistended. Bowel sounds present. No organomegaly or mass.  EXTREMITIES: 1-2+ lower extremity and pedal edema, no cyanosis, or clubbing.  NEUROLOGIC: Cranial nerves II through XII are intact. Muscle strength 5/5 in all extremities. Sensation intact. Gait not checked.  PSYCHIATRIC: The patient is alert and oriented x 3.  SKIN: No obvious rash,  lesion, or ulcer.   ORDERS/RESULTS REVIEWED:   CBC  Recent Labs Lab 12/24/15 1158 12/25/15 0843  WBC 11.3* 11.8*  HGB 9.5* 9.9*  HCT 30.0* 30.5*  PLT 470* 450*  MCV 77.7* 77.0*  MCH 24.7* 24.9*  MCHC 31.8* 32.3  RDW 17.7* 17.4*   ------------------------------------------------------------------------------------------------------------------  Chemistries   Recent Labs Lab 12/24/15 1158 12/24/15 1159 12/24/15 1949 12/25/15 0502 12/25/15 0843  NA 130*  --   --  127*  --   K 2.6*  --  3.5 3.1*  --   CL 96*  --   --  95*  --   CO2 24  --   --  20*  --   GLUCOSE 120*  --   --  101*  --   BUN 28*  --   --  25*  --   CREATININE 0.93  --   --  0.80  --   CALCIUM 9.1  --   --  8.6*  --   MG  --  1.5*  --   --  1.7   ------------------------------------------------------------------------------------------------------------------ estimated creatinine clearance is 56.1 mL/min (by C-G formula based on Cr of 0.8). ------------------------------------------------------------------------------------------------------------------  Recent Labs  12/24/15 1159  TSH 3.091    Cardiac Enzymes  Recent Labs Lab 12/24/15 1158 12/25/15 0843 12/25/15 1446  TROPONINI <0.03 <0.03 <0.03   ------------------------------------------------------------------------------------------------------------------ Invalid input(s): POCBNP ---------------------------------------------------------------------------------------------------------------  RADIOLOGY: Dg Chest 2 View  12/24/2015  CLINICAL DATA:  Short of breath.  Atrial fibrillation EXAM: CHEST  2 VIEW COMPARISON:  04/11/2012 FINDINGS: Cardiac enlargement. Negative for heart failure. Negative for infiltrate effusion or mass. Moderate to severe compression fracture in the lower thoracic  spine, not present 02/25/2010. Indeterminate age. IMPRESSION: No active cardiopulmonary disease. Electronically Signed   By: Franchot Gallo M.D.   On:  12/24/2015 12:45    EKG:  Orders placed or performed during the hospital encounter of 12/24/15  . EKG 12-Lead  . EKG 12-Lead    ASSESSMENT AND PLAN:  Active Problems:   A-fib (HCC)  #1 A. fib, RVR, new onset, patient is on Cardizem IV drip, weaning down, continue metoprolol, initiate patient on Eliquis, appreciate cardiology consultation, echocardiogram is pending. TSH is normal at 3.09. Cardiac enzymes 3 are within normal limits #2. Hyponatremia, follow with therapy, TSH is normal. #3. Hypokalemia, supplement  orally, recheck tomorrow morning. #4. Leukocytosis, no obvious infection on chest x-ray, get urinalysis, initiate antibiotics if urinalysis/urine cultures are abnormal #5. Hypomagnesemia, supplemented, physiologic now  Management plans discussed with the patient, family and they are in agreement.   DRUG ALLERGIES:  Allergies  Allergen Reactions  . Fentanyl Other (See Comments)    Hallucinations and mental alterations    CODE STATUS:     Code Status Orders        Start     Ordered   12/24/15 1515  Full code   Continuous     12/24/15 1515    Code Status History    Date Active Date Inactive Code Status Order ID Comments User Context   This patient has a current code status but no historical code status.    Advance Directive Documentation        Most Recent Value   Type of Advance Directive  Healthcare Power of [facility] Son John]   Pre-existing out of facility DNR order (yellow form or pink MOST form)     "MOST" Form in Place?        TOTAL TIME TAKING CARE OF THIS PATIENT: 35 minutes.    Theodoro Grist M.D on 12/25/2015 at 4:07 PM  Between 7am to 6pm - Pager - [phone]  After 6pm go to [email or web address] - password [email or web address] [facility]  Office  [phone]  CC: Primary care physician; Idelle Crouch, MD

## 2015-12-25 NOTE — Progress Notes (Signed)
ANTICOAGULATION CONSULT NOTE - Initial Consult  Pharmacy Consult for heparin drip Indication: atrial fibrillation  Allergies  Allergen Reactions  . Fentanyl Other (See Comments)    Hallucinations and mental alterations   Patient Measurements: Height: 5' (152.4 cm) Weight: 179 lb 14.3 oz (81.6 kg) IBW/kg (Calculated) : 45.5 Heparin Dosing Weight: 65 kg  Vital Signs: Temp: 98.3 F (36.8 C) (03/21 0000) Temp Source: Oral (03/21 0000) BP: 116/96 mmHg (03/21 0200) Pulse Rate: 91 (03/21 0200)  Labs:  Recent Labs  12/24/15 1158 12/24/15 1159 12/24/15 1650 12/25/15 0049  HGB 9.5*  --   --   --   HCT 30.0*  --   --   --   PLT 470*  --   --   --   APTT  --   --  25  --   LABPROT  --  15.0  --   --   INR  --  1.16  --   --   HEPARINUNFRC  --   --   --  0.47  CREATININE 0.93  --   --   --   TROPONINI <0.03  --   --   --     Estimated Creatinine Clearance: 48.7 mL/min (by C-G formula based on Cr of 0.93).  Medical History: Past Medical History  Diagnosis Date  . Hypertension   . Ulcer   . Osteoporosis   . Obesity   . History of chemotherapy   . Anemia   . GERD (gastroesophageal reflux disease)   . Fall 2012  . 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 Neoadjuvant chemotherapy. Wide local excision, sentinel node biopsy completed May 2011 followed by whole breast radiation.   Assessment: Pharmacy consulted to monitor and dose heparin drip for this patient admitted with newly diagnosed atrial fibrillation. Patients heparin dosing weight is 65 kg. Patient was not taking anticoagulants prior to admission per med rec.   Baseline labs: Hgb 9.5 Plt 470  APTT & protime/INR ordered STAT  Goal of Therapy:  Heparin level 0.3-0.7 units/ml Monitor platelets by anticoagulation protocol: Yes   Plan:  Give 3250 units bolus x 1 (50 units/kg) Start heparin infusion at 900 units/hr (~14 units/kg/hr) Check anti-Xa level in 8 hours and  daily while on heparin Continue to monitor H&H and platelets  3/12 0100 heparin level 0.47. Recheck in 8 hours to confirm. CBC ordered with heparin level.  Eloise Harman, PharmD Clinical Pharmacist 12/25/2015,2:34 AM

## 2015-12-25 NOTE — Progress Notes (Signed)
Pharmacy Consult for Electrolyte Replacement   Allergies  Allergen Reactions  . Fentanyl Other (See Comments)    Hallucinations and mental alterations    Patient Measurements: Height: 5' (152.4 cm) Weight: 177 lb 0.5 oz (80.3 kg) IBW/kg (Calculated) : 45.5  Vital Signs: Temp: 97.6 F (36.4 C) (03/21 0800) Temp Source: Oral (03/21 0800) BP: 91/41 mmHg (03/21 1000) Pulse Rate: 119 (03/21 1000) Intake/Output from previous day: 03/20 0701 - 03/21 0700 In: 423.7 [I.V.:273.7; IV Piggyback:150] Out: 1150 [Urine:1150] Intake/Output from this shift: Total I/O In: 483 [P.O.:360; I.V.:3; Other:120] Out: 400 [Urine:400]  Labs:  Recent Labs  12/24/15 1158 12/24/15 1159 12/24/15 1650 12/25/15 0843  WBC 11.3*  --   --  11.8*  HGB 9.5*  --   --  9.9*  HCT 30.0*  --   --  30.5*  PLT 470*  --   --  450*  APTT  --   --  25  --   INR  --  1.16  --   --      Recent Labs  12/24/15 1158 12/24/15 1159 12/24/15 1949 12/25/15 0502 12/25/15 0843  NA 130*  --   --  127*  --   K 2.6*  --  3.5 3.1*  --   CL 96*  --   --  95*  --   CO2 24  --   --  20*  --   GLUCOSE 120*  --   --  101*  --   BUN 28*  --   --  25*  --   CREATININE 0.93  --   --  0.80  --   CALCIUM 9.1  --   --  8.6*  --   MG  --  1.5*  --   --  1.7   Estimated Creatinine Clearance: 56.1 mL/min (by C-G formula based on Cr of 0.8).    Recent Labs  12/24/15 1721  GLUCAP 111*    Medical History: Past Medical History  Diagnosis Date  . Hypertension   . Ulcer   . Osteoporosis   . Obesity   . History of chemotherapy   . Anemia   . GERD (gastroesophageal reflux disease)   . Fall 2012  . 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 Neoadjuvant chemotherapy. Wide local excision, sentinel node biopsy completed May 2011 followed by whole breast radiation.    Medications:  Scheduled:  . apixaban  5 mg Oral BID  . aspirin  325 mg Oral Daily  . donepezil  10 mg Oral  QHS  . FLUoxetine  20 mg Oral Daily  . furosemide  20 mg Intravenous Daily  . magnesium sulfate 1 - 4 g bolus IVPB  2 g Intravenous Once  . metoprolol tartrate  100 mg Oral BID  . oxybutynin  5 mg Oral BID  . potassium chloride  40 mEq Oral Once  . sodium chloride flush  3 mL Intravenous Q12H  . sodium chloride flush  3 mL Intravenous Q12H   Infusions:    Assessment: Pharmacy consulted to assist in managing electrolytes in this 75 y/o F with new-onset atrial fibrillation.   Plan:  Will replace electrolytes with magnesium sulfate 2 g iv once and potassium choride packet 40 meq once. Will f/u repeat potassium level at 1800 and all electrolytes with AM labs.   Ulice Dash D 12/25/2015,11:10 AM

## 2015-12-25 NOTE — Consult Note (Signed)
[facility] Consultation Note  Patient ID: Joyce Horton, MRN: [id number], DOB/AGE: [birth year] 75 y.o. Admit date: 12/24/2015   Date of Consult: 12/25/2015 Primary Physician: Idelle Crouch, MD Primary Cardiologist: None  Chief Complaint:  Chief Complaint  Patient presents with  . Shortness of Breath   Reason for Consult: atrial fibrillation  HPI: 75 y.o. female with no evidence of history of cardiovascular disease in the recent past who is had new onset of atrial fibrillation with rapid ventricular rate. The patient is mildly short of breath but no evidence of significant congestive heart failure or myocardial infarction. At this time the patient has had medication management which is controlled her heart rate and improved how she feels. The patient has had no evidence of myocardial infarction by current evaluation and no evidence of significant elevation of troponin  Past Medical History  Diagnosis Date  . Hypertension   . Ulcer   . Osteoporosis   . Obesity   . History of chemotherapy   . Anemia   . GERD (gastroesophageal reflux disease)   . Fall 2012  . 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 Neoadjuvant chemotherapy. Wide local excision, sentinel node biopsy completed May 2011 followed by whole breast radiation.      Surgical History:  Past Surgical History  Procedure Laterality Date  . Colonoscopy  2002,2007,2010    [place]  . Breast biopsy  2000,2007,2010    left breast 2010  . Breast surgery Left 2011    lumpectomy  . Skin lesion excision  [id number]  . Stents      R leg  . Tibia fracture surgery Right July 2013    right femur fracture with internal fixation.     Home Meds: Prior to Admission medications   Medication Sig Start Date End Date Taking? Authorizing Provider  acetaminophen (TYLENOL) 500 MG tablet Take 500 mg by mouth every 6 (six) hours as needed.   Yes Historical Provider, MD   ALPRAZolam Duanne Moron) 0.25 MG tablet Take 0.25 mg by mouth at bedtime as needed for anxiety.   Yes Historical Provider, MD  aspirin 325 MG tablet Take 325 mg by mouth daily.   Yes Historical Provider, MD  Calcium-Vitamin D (CALTRATE 600 PLUS-VIT D PO) Take 1 tablet by mouth daily.    Yes Historical Provider, MD  cetirizine (ZYRTEC) 10 MG chewable tablet Chew 10 mg by mouth as needed for allergies.   Yes Historical Provider, MD  donepezil (ARICEPT) 10 MG tablet Take 10 mg by mouth at bedtime.   Yes Historical Provider, MD  FLUoxetine (PROZAC) 20 MG tablet Take 20 mg by mouth daily.   Yes Historical Provider, MD  lisinopril-hydrochlorothiazide (PRINZIDE,ZESTORETIC) 20-25 MG per tablet Take 1 tablet by mouth daily.   Yes Historical Provider, MD  metoprolol succinate (TOPROL-XL) 50 MG 24 hr tablet Take 50 mg by mouth daily.    Yes Historical Provider, MD  Multiple Vitamins-Minerals (MULTIVITAMIN WITH MINERALS) tablet Take 1 tablet by mouth daily.   Yes Historical Provider, MD  naproxen sodium (ANAPROX) 220 MG tablet Take 220 mg by mouth 2 (two) times daily as needed.   Yes Historical Provider, MD  Nebivolol HCl (BYSTOLIC PO) Take 10 mg by mouth daily.    Yes Historical Provider, MD  oxybutynin (DITROPAN) 5 MG tablet Take 5 mg by mouth 2 (two) times daily.   Yes Historical Provider, MD    Inpatient Medications:  . aspirin  325 mg Oral  Daily  . donepezil  10 mg Oral QHS  . FLUoxetine  20 mg Oral Daily  . furosemide  20 mg Intravenous Daily  . magnesium oxide  400 mg Oral Daily  . metoprolol tartrate  50 mg Oral BID  . oxybutynin  5 mg Oral BID  . sodium chloride flush  3 mL Intravenous Q12H  . sodium chloride flush  3 mL Intravenous Q12H   . diltiazem (CARDIZEM) infusion 10 mg/hr (12/25/15 0500)  . heparin 900 Units/hr (12/25/15 0500)    Allergies:  Allergies  Allergen Reactions  . Fentanyl Other (See Comments)    Hallucinations and mental alterations    Social History   Social History   . Marital Status: Widowed    Spouse Name: N/A  . Number of Children: N/A  . Years of Education: N/A   Occupational History  . Not on file.   Social History Main Topics  . Smoking status: Never Smoker   . Smokeless tobacco: Never Used  . Alcohol Use: 0.0 oz/week    0 Standard drinks or equivalent per week  . Drug Use: No  . Sexual Activity: Not on file   Other Topics Concern  . Not on file   Social History Narrative     Family History  Problem Relation Age of Onset  . Cancer Mother     breast  . CAD Father      Review of Systems Positive for Shortness of breath irregular heartbeat Negative for: General:  chills, fever, night sweats or weight changes.  Cardiovascular: PND orthopnea syncope dizziness  Dermatological skin lesions rashes Respiratory: Cough congestion Urologic: Frequent urination urination at night and hematuria Abdominal: negative for nausea, vomiting, diarrhea, bright red blood per rectum, melena, or hematemesis Neurologic: negative for visual changes, and/or hearing changes  All other systems reviewed and are otherwise negative except as noted above.  Labs:  Recent Labs  12/24/15 1158  TROPONINI <0.03   Lab Results  Component Value Date   WBC 11.3* 12/24/2015   HGB 9.5* 12/24/2015   HCT 30.0* 12/24/2015   MCV 77.7* 12/24/2015   PLT 470* 12/24/2015    Recent Labs Lab 12/25/15 0502  NA 127*  K 3.1*  CL 95*  CO2 20*  BUN 25*  CREATININE 0.80  CALCIUM 8.6*  GLUCOSE 101*   No results found for: CHOL, HDL, LDLCALC, TRIG No results found for: DDIMER  Radiology/Studies:  Dg Chest 2 View  12/24/2015  CLINICAL DATA:  Short of breath.  Atrial fibrillation EXAM: CHEST  2 VIEW COMPARISON:  04/11/2012 FINDINGS: Cardiac enlargement. Negative for heart failure. Negative for infiltrate effusion or mass. Moderate to severe compression fracture in the lower thoracic spine, not present 02/25/2010. Indeterminate age. IMPRESSION: No active  cardiopulmonary disease. Electronically Signed   By: Franchot Gallo M.D.   On: 12/24/2015 12:45    EKG: Atrial fibrillation with rapid ventricular rate and nonspecific ST and T-wave changes  Weights: Filed Weights   12/24/15 1527 12/24/15 1730 12/25/15 0457  Weight: 183 lb 9.6 oz (83.28 kg) 179 lb 14.3 oz (81.6 kg) 177 lb 0.5 oz (80.3 kg)     Physical Exam: Blood pressure 107/62, pulse 115, temperature 98 F (36.7 C), temperature source Oral, resp. rate 26, height 5' (1.524 m), weight 177 lb 0.5 oz (80.3 kg), SpO2 94 %. Body mass index is 34.57 kg/(m^2). General: Well developed, well nourished, in no acute distress. Head eyes ears nose throat: Normocephalic, atraumatic, sclera non-icteric, no xanthomas, nares are  without discharge. No apparent thyromegaly and/or mass  Lungs: Normal respiratory effort.  Few wheezes, no rales, no rhonchi.  Heart: Irregular with normal S1 S2. no murmur gallop, no rub, PMI is normal size and placement, carotid upstroke normal without bruit, jugular venous pressure is normal Abdomen: Soft, non-tender, non-distended with normoactive bowel sounds. No hepatomegaly. No rebound/guarding. No obvious abdominal masses. Abdominal aorta is normal size without bruit Extremities: No edema. no cyanosis, no clubbing, no ulcers  Peripheral : 2+ bilateral upper extremity pulses, 2+ bilateral femoral pulses, 2+ bilateral dorsal pedal pulse Neuro: Alert and oriented. No facial asymmetry. No focal deficit. Moves all extremities spontaneously. Musculoskeletal: Normal muscle tone without kyphosis Psych:  Responds to questions appropriately with a normal affect.    Assessment: 75 year old female with no evidence of previous cardiovascular history having atrial fibrillation with rapid ventricular rate and nonspecific ST changes and no evidence of congestive heart failure and/or myocardial infarction  Plan: 1. Continue metoprolol at increased dose for better heart rate control for  atrial fibrillation nonvalvular in nature and paroxysmal 2. Continue anticoagulation and change to oral medication management with Eliquis 5 mg twice per day 3. Continue Lasix as needed for pulmonary edema and/or diastolic dysfunction heart failure if patient has said due to atrial fibrillation with rapid ventricular rate 4. Echocardiogram for LV systolic dysfunction 5. If patient feeling well and has reasonable control of heart rate with above which discharged home with adjustments of medication management as an outpatient  Signed, Corey Skains M.D. [facility] 12/25/2015, 8:01 AM

## 2015-12-25 NOTE — Progress Notes (Signed)
Pharmacy Consult for Electrolyte Replacement   Allergies  Allergen Reactions  . Fentanyl Other (See Comments)    Hallucinations and mental alterations    Patient Measurements: Height: 5' (152.4 cm) Weight: 177 lb 0.5 oz (80.3 kg) IBW/kg (Calculated) : 45.5  Vital Signs: BP: 95/73 mmHg (03/21 1928) Pulse Rate: 148 (03/21 1800) Intake/Output from previous day: 03/20 0701 - 03/21 0700 In: 423.7 [I.V.:273.7; IV Piggyback:150] Out: 1150 [Urine:1150] Intake/Output from this shift:    Labs:  Recent Labs  12/24/15 1158 12/24/15 1159 12/24/15 1650 12/25/15 0843  WBC 11.3*  --   --  11.8*  HGB 9.5*  --   --  9.9*  HCT 30.0*  --   --  30.5*  PLT 470*  --   --  450*  APTT  --   --  25  --   INR  --  1.16  --   --      Recent Labs  12/24/15 1158 12/24/15 1159 12/24/15 1949 12/25/15 0502 12/25/15 0843 12/25/15 1902  NA 130*  --   --  127*  --  132*  K 2.6*  --  3.5 3.1*  --  3.7  CL 96*  --   --  95*  --  97*  CO2 24  --   --  20*  --  25  GLUCOSE 120*  --   --  101*  --  130*  BUN 28*  --   --  25*  --  27*  CREATININE 0.93  --   --  0.80  --  0.94  CALCIUM 9.1  --   --  8.6*  --  8.9  MG  --  1.5*  --   --  1.7  --    Estimated Creatinine Clearance: 47.7 mL/min (by C-G formula based on Cr of 0.94).    Recent Labs  12/24/15 1721  GLUCAP 111*    Medical History: Past Medical History  Diagnosis Date  . Hypertension   . Ulcer   . Osteoporosis   . Obesity   . History of chemotherapy   . Anemia   . GERD (gastroesophageal reflux disease)   . Fall 2012  . 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 Neoadjuvant chemotherapy. Wide local excision, sentinel node biopsy completed May 2011 followed by whole breast radiation.    Medications:  Scheduled:  . apixaban  5 mg Oral BID  . aspirin  325 mg Oral Daily  . donepezil  10 mg Oral QHS  . FLUoxetine  20 mg Oral Daily  . metoprolol tartrate  100 mg Oral BID  .  oxybutynin  5 mg Oral BID  . sodium chloride flush  3 mL Intravenous Q12H  . sodium chloride flush  3 mL Intravenous Q12H   Infusions:    Assessment: Pharmacy consulted to assist in managing electrolytes in this 75 y/o F with new-onset atrial fibrillation.   Plan:  Follow up K of 3.7 is WNL - no supplementation needed at this time. Will recheck electrolytes with AM labs tomorrow.  Lenis Noon, PharmD Clinical Pharmacist 12/25/2015,8:09 PM

## 2015-12-25 NOTE — Care Management (Signed)
Presented to ED from PCP office for shortness of breath and rapid heart rate.  Found to be in rapid a fib- new onset. Admitted to icu stepdown.  Cardizem drip .   Anticipate need for Eliquis

## 2015-12-26 ENCOUNTER — Inpatient Hospital Stay
Admit: 2015-12-26 | Discharge: 2015-12-26 | Disposition: A | Discharge status: Home / Self Care | Payer: Medicare Other | Attending: Internal Medicine | Admitting: Internal Medicine

## 2015-12-26 LAB — BASIC METABOLIC PANEL
ANION GAP: 9 (ref 5–15)
BUN: 27 mg/dL — ABNORMAL HIGH (ref 6–20)
CHLORIDE: 99 mmol/L — AB (ref 101–111)
CO2: 25 mmol/L (ref 22–32)
CREATININE: 0.62 mg/dL (ref 0.44–1.00)
Calcium: 9 mg/dL (ref 8.9–10.3)
GFR calc non Af Amer: >60 mL/min (ref >60)
Glucose, Bld: 115 mg/dL — ABNORMAL HIGH (ref 65–99)
POTASSIUM: 3.5 mmol/L (ref 3.5–5.1)
SODIUM: 133 mmol/L — AB (ref 135–145)

## 2015-12-26 LAB — URINALYSIS COMPLETE WITH MICROSCOPIC (ARMC ONLY)
BILIRUBIN URINE: NEGATIVE
GLUCOSE, UA: NEGATIVE mg/dL
Ketones, ur: NEGATIVE mg/dL
Nitrite: NEGATIVE
Protein, ur: NEGATIVE mg/dL
Specific Gravity, Urine: 1.016 (ref 1.005–1.030)
Squamous Epithelial / LPF: NONE SEEN
pH: 6 (ref 5.0–8.0)

## 2015-12-26 LAB — PHOSPHORUS: PHOSPHORUS: 2.6 mg/dL (ref 2.5–4.6)

## 2015-12-26 LAB — ECHOCARDIOGRAM COMPLETE
Height: 60 in
WEIGHTICAEL: 2832.47 [oz_av]

## 2015-12-26 LAB — MAGNESIUM: MAGNESIUM: 2 mg/dL (ref 1.7–2.4)

## 2015-12-26 MED ORDER — DILTIAZEM HCL ER COATED BEADS 240 MG PO CP24
240.0000 mg | ORAL_CAPSULE | Freq: Every day | ORAL | Status: DC
  Administered 2015-12-26 – 2015-12-27 (×2): 240 mg via ORAL
  Filled 2015-12-26 (×2): qty 1

## 2015-12-26 MED ORDER — PREDNISONE 20 MG PO TABS
40.0000 mg | ORAL_TABLET | Freq: Every day | ORAL | Status: DC
  Administered 2015-12-26 – 2015-12-27 (×2): 40 mg via ORAL
  Filled 2015-12-26 (×2): qty 2

## 2015-12-26 MED ORDER — DILTIAZEM HCL ER COATED BEADS 120 MG PO CP24
120.0000 mg | ORAL_CAPSULE | Freq: Every day | ORAL | Status: DC
  Administered 2015-12-26: 120 mg via ORAL
  Filled 2015-12-26: qty 1

## 2015-12-26 NOTE — Progress Notes (Signed)
Pharmacy Consult for Electrolyte Replacement   Allergies  Allergen Reactions  . Fentanyl Other (See Comments)    Hallucinations and mental alterations    Patient Measurements: Height: 5' (152.4 cm) Weight: 177 lb 0.5 oz (80.3 kg) IBW/kg (Calculated) : 45.5  Vital Signs: Temp: 98.4 F (36.9 C) (03/22 0800) Temp Source: Oral (03/22 0800) BP: 122/69 mmHg (03/22 0900) Pulse Rate: 99 (03/22 0920) Intake/Output from previous day: 03/21 0701 - 03/22 0700 In: 1103.8 [P.O.:900; I.V.:83.8] Out: 800 [Urine:800] Intake/Output from this shift: Total I/O In: 3 [I.V.:3] Out: -   Labs:  Recent Labs  12/24/15 1158 12/24/15 1159 12/24/15 1650 12/25/15 0843  WBC 11.3*  --   --  11.8*  HGB 9.5*  --   --  9.9*  HCT 30.0*  --   --  30.5*  PLT 470*  --   --  450*  APTT  --   --  25  --   INR  --  1.16  --   --      Recent Labs  12/24/15 1159  12/25/15 0502 12/25/15 0843 12/25/15 1902 12/26/15 0448  NA  --   --  127*  --  132* 133*  K  --   < > 3.1*  --  3.7 3.5  CL  --   --  95*  --  97* 99*  CO2  --   --  20*  --  25 25  GLUCOSE  --   --  101*  --  130* 115*  BUN  --   --  25*  --  27* 27*  CREATININE  --   --  0.80  --  0.94 0.62  CALCIUM  --   --  8.6*  --  8.9 9.0  MG 1.5*  --   --  1.7  --  2.0  PHOS  --   --   --   --   --  2.6  < > = values in this interval not displayed. Estimated Creatinine Clearance: 56.1 mL/min (by C-G formula based on Cr of 0.62).    Recent Labs  12/24/15 1721  GLUCAP 111*    Medical History: Past Medical History  Diagnosis Date  . Hypertension   . Ulcer   . Osteoporosis   . Obesity   . History of chemotherapy   . Anemia   . GERD (gastroesophageal reflux disease)   . Fall 2012  . 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 Neoadjuvant chemotherapy. Wide local excision, sentinel node biopsy completed May 2011 followed by whole breast radiation.    Medications:  Scheduled:  .  apixaban  5 mg Oral BID  . aspirin  325 mg Oral Daily  . diltiazem  240 mg Oral Daily  . donepezil  10 mg Oral QHS  . FLUoxetine  20 mg Oral Daily  . metoprolol tartrate  100 mg Oral BID  . oxybutynin  5 mg Oral BID  . predniSONE  40 mg Oral Q breakfast  . sodium chloride flush  3 mL Intravenous Q12H  . sodium chloride flush  3 mL Intravenous Q12H   Infusions:    Assessment: Pharmacy consulted to assist in managing electrolytes in this 75 y/o F with new-onset atrial fibrillation.   Plan:  Electrolytes are WNL so will f/u AM labs.   Napoleon Form, PharmD Clinical Pharmacist 12/26/2015,2:02 PM

## 2015-12-26 NOTE — Progress Notes (Signed)
Patient startled awake, sat up in bed, and stated she did not know where she was or what was going on.  This RN reminded her of what was going on and why she was in the hospital.  Her son was called and talked to her briefly on the phone.  Her son told this RN that she frequently does this due to her dementia and that her PRN Xanax usually calms her down.  PRN Xanax was given and the patient began to remember some details from her stay in the hospital.  She is able to recount all her family and is telling stories about her growing up.  Her neuro assessment is in tact other than some confusion and her vitals are stable.  The patient is now resting comfortably in her bed.  Will continue to monitor.

## 2015-12-26 NOTE — Progress Notes (Signed)
*  PRELIMINARY RESULTS* Echocardiogram 2D Echocardiogram has been performed.  Joyce Horton 12/26/2015, 10:43 AM

## 2015-12-26 NOTE — Progress Notes (Signed)
[facility] [place] NAME: Joyce Horton    MR#:  [id number]  DATE OF BIRTH:  11/06/1940  SUBJECTIVE:  CHIEF COMPLAINT:   Chief Complaint  Patient presents with  . Shortness of Breath   Admitted for tachycardia and SOB with dizziness. Found to be in Afib Has baseline dementia Poor historian. HR still tachy into 120s with Afib.   Review of Systems  Unable to perform ROS: dementia    VITAL SIGNS: Blood pressure 122/69, pulse 64, temperature 98.4 F (36.9 C), temperature source Oral, resp. rate 22, height 5' (1.524 m), weight 80.3 kg (177 lb 0.5 oz), SpO2 96 %.  PHYSICAL EXAMINATION:   GENERAL:  75 y.o.-year-old patient lying in the bed with no acute distress. Some somnolent EYES: Pupils equal, round, reactive to light and accommodation. No scleral icterus. Extraocular muscles intact.  HEENT: Head atraumatic, normocephalic. Oropharynx and nasopharynx clear.  NECK:  Supple, no jugular venous distention. No thyroid enlargement, no tenderness.  LUNGS:  no rales,rhonchi or crepitation. No use of accessory muscles of respiration. Bilateral wheezing CARDIOVASCULAR: S1, S2 irregularly irregular . No murmurs, rubs, or gallops. Tachycardia ABDOMEN: Soft, nontender, nondistended. Bowel sounds present. No organomegaly or mass.  EXTREMITIES: 1-2+ lower extremity and pedal edema, no cyanosis, or clubbing.  NEUROLOGIC: Cranial nerves II through XII are intact. Muscle strength 5/5 in all extremities. Sensation intact. Gait not checked.  PSYCHIATRIC: The patient is alert and oriented. SKIN: No obvious rash, lesion, or ulcer.   ORDERS/RESULTS REVIEWED:   CBC  Recent Labs Lab 12/24/15 1158 12/25/15 0843  WBC 11.3* 11.8*  HGB 9.5* 9.9*  HCT 30.0* 30.5*  PLT 470* 450*  MCV 77.7* 77.0*  MCH 24.7* 24.9*  MCHC 31.8* 32.3  RDW 17.7* 17.4*    ------------------------------------------------------------------------------------------------------------------  Chemistries   Recent Labs Lab 12/24/15 1158 12/24/15 1159 12/24/15 1949 12/25/15 0502 12/25/15 0843 12/25/15 1902 12/26/15 0448  NA 130*  --   --  127*  --  132* 133*  K 2.6*  --  3.5 3.1*  --  3.7 3.5  CL 96*  --   --  95*  --  97* 99*  CO2 24  --   --  20*  --  25 25  GLUCOSE 120*  --   --  101*  --  130* 115*  BUN 28*  --   --  25*  --  27* 27*  CREATININE 0.93  --   --  0.80  --  0.94 0.62  CALCIUM 9.1  --   --  8.6*  --  8.9 9.0  MG  --  1.5*  --   --  1.7  --  2.0   ------------------------------------------------------------------------------------------------------------------ estimated creatinine clearance is 56.1 mL/min (by C-G formula based on Cr of 0.62). ------------------------------------------------------------------------------------------------------------------  Recent Labs  12/24/15 1159  TSH 3.091    Cardiac Enzymes  Recent Labs Lab 12/25/15 0843 12/25/15 1446 12/25/15 1902  TROPONINI <0.03 <0.03 <0.03   ------------------------------------------------------------------------------------------------------------------ Invalid input(s): POCBNP ---------------------------------------------------------------------------------------------------------------  RADIOLOGY: Dg Chest 2 View  12/24/2015  CLINICAL DATA:  Short of breath.  Atrial fibrillation EXAM: CHEST  2 VIEW COMPARISON:  04/11/2012 FINDINGS: Cardiac enlargement. Negative for heart failure. Negative for infiltrate effusion or mass. Moderate to severe compression fracture in the lower thoracic spine, not present 02/25/2010. Indeterminate age. IMPRESSION: No active cardiopulmonary disease. Electronically Signed   By: Franchot Gallo M.D.   On: 12/24/2015 12:45    EKG:  Orders  placed or performed during the hospital encounter of 12/24/15  . EKG 12-Lead  . EKG 12-Lead     ASSESSMENT AND PLAN:  Active Problems:   A-fib ([place])  # A. fib, RVR, new onset - HR still uncontrolled and tachycardic ON Metoprolol from home with increased dose. Still tachycardic. Will add cardizem CD 120 mg daily. Continue Eliquis. Discussed with Dr. Nehemiah Massed.  # Acute bronchitis Start Prednsione  # Mild chronic Hyponatremia  # Hypokalemia Replaced  # Hypomagnesemia Supplemented  # Dementia  Management plans discussed with the patient, Dr. Nehemiah Massed and nursing staff   DRUG ALLERGIES:  Allergies  Allergen Reactions  . Fentanyl Other (See Comments)    Hallucinations and mental alterations    CODE STATUS:     Code Status Orders        Start     Ordered   12/24/15 1515  Full code   Continuous     12/24/15 1515    Code Status History    Date Active Date Inactive Code Status Order ID Comments User Context   This patient has a current code status but no historical code status.    Advance Directive Documentation        Most Recent Value   Type of Advance Directive  Healthcare Power of [facility]]   Pre-existing out of facility DNR order (yellow form or pink MOST form)     "MOST" Form in Place?        TOTAL TIME TAKING CARE OF THIS PATIENT: 35 minutes.    Hillary Bow R M.D on 12/26/2015 at 9:13 AM  Between 7am to 6pm - Pager - [phone]  After 6pm go to [email or web address] - password [email or web address] [email or web address]  Office  [phone]  CC: Primary care physician; Idelle Crouch, MD

## 2015-12-26 NOTE — Progress Notes (Signed)
[facility] Encounter Note  Patient: Joyce Horton / Admit Date: 12/24/2015 / Date of Encounter: 12/26/2015, 9:02 AM   Subjective: Continued atrial fibrillation with rapid ventricular rate. Continue shortness of breath and weakness. No evidence of myocardial infarction. No evidence of congestive heart failure  Review of Systems: Positive for: Weakness and palpitation Negative for: Vision change, hearing change, syncope, dizziness, nausea, vomiting,diarrhea, bloody stool, stomach pain, cough, congestion, diaphoresis, urinary frequency, urinary pain,skin lesions, skin rashes Others previously listed  Objective: Telemetry: Atrial fibrillation with controlled ventricular rate Physical Exam: Blood pressure 112/83, pulse 97, temperature 98.4 F (36.9 C), temperature source Oral, resp. rate 22, height 5' (1.524 m), weight 177 lb 0.5 oz (80.3 kg), SpO2 96 %. Body mass index is 34.57 kg/(m^2). General: Well developed, well nourished, in no acute distress. Head: Normocephalic, atraumatic, sclera non-icteric, no xanthomas, nares are without discharge. Neck: No apparent masses Lungs: Normal respirations with some wheezes, no rhonchi, no rales , no crackles   Heart: Irregular rate and rhythm, normal S1 S2, no murmur, no rub, no gallop, PMI is normal size and placement, carotid upstroke normal without bruit, jugular venous pressure normal Abdomen: Soft, non-tender, non-distended with normoactive bowel sounds. No hepatosplenomegaly. Abdominal aorta is normal size without bruit Extremities: Trace edema, no clubbing, no cyanosis, no ulcers,  Peripheral: 2+ radial, 2+ femoral, 2+ dorsal pedal pulses Neuro: Alert and oriented. Moves all extremities spontaneously. Psych:  Responds to questions appropriately with a normal affect.   Intake/Output Summary (Last 24 hours) at 12/26/15 0902 Last data filed at 12/26/15 0600  Gross per 24 hour  Intake 1103.75 ml  Output    400 ml  Net  703.75 ml    Inpatient Medications:  . apixaban  5 mg Oral BID  . aspirin  325 mg Oral Daily  . diltiazem  240 mg Oral Daily  . donepezil  10 mg Oral QHS  . FLUoxetine  20 mg Oral Daily  . metoprolol tartrate  100 mg Oral BID  . oxybutynin  5 mg Oral BID  . sodium chloride flush  3 mL Intravenous Q12H  . sodium chloride flush  3 mL Intravenous Q12H   Infusions:    Labs:  Recent Labs  12/25/15 0843 12/25/15 1902 12/26/15 0448  NA  --  132* 133*  K  --  3.7 3.5  CL  --  97* 99*  CO2  --  25 25  GLUCOSE  --  130* 115*  BUN  --  27* 27*  CREATININE  --  0.94 0.62  CALCIUM  --  8.9 9.0  MG 1.7  --  2.0  PHOS  --   --  2.6   No results for input(s): AST, ALT, ALKPHOS, BILITOT, PROT, ALBUMIN in the last 72 hours.  Recent Labs  12/24/15 1158 12/25/15 0843  WBC 11.3* 11.8*  HGB 9.5* 9.9*  HCT 30.0* 30.5*  MCV 77.7* 77.0*  PLT 470* 450*    Recent Labs  12/24/15 1158 12/25/15 0843 12/25/15 1446 12/25/15 1902  TROPONINI <0.03 <0.03 <0.03 <0.03   Invalid input(s): POCBNP No results for input(s): HGBA1C in the last 72 hours.   Weights: Filed Weights   12/24/15 1527 12/24/15 1730 12/25/15 0457  Weight: 183 lb 9.6 oz (83.28 kg) 179 lb 14.3 oz (81.6 kg) 177 lb 0.5 oz (80.3 kg)     Radiology/Studies:  Dg Chest 2 View  12/24/2015  CLINICAL DATA:  Short of breath.  Atrial fibrillation EXAM: CHEST  2 VIEW COMPARISON:  04/11/2012 FINDINGS: Cardiac enlargement. Negative for heart failure. Negative for infiltrate effusion or mass. Moderate to severe compression fracture in the lower thoracic spine, not present 02/25/2010. Indeterminate age. IMPRESSION: No active cardiopulmonary disease. Electronically Signed   By: Franchot Gallo M.D.   On: 12/24/2015 12:45     Assessment and Recommendation  75 y.o. female with essential hypertension mixed hyperlipidemia and new onset atrial fibrillation with rapid ventricular rate now better controlled on metoprolol and diltiazem  combination although not converted to normal sinus rhythm with multiple causes. The patient has had no evidence of myocardial infarction and or congestive heart failure at this time 1. Increase dosage of oral beta blocker and diltiazem for better heart rate control and possible spontaneous conversion to normal sinus rhythm 2. Continue Eliquis 5 mg twice per day for further risk reduction in stroke with atrial fibrillation 4. Begin ambulation for adjustments of medication management as necessary 5. No further cardiac diagnostics necessary at this time with the possible discharge to home and further follow-up with adjustments of medications as outpatient  Signed, Serafina Royals M.D. FACC

## 2015-12-26 NOTE — Progress Notes (Signed)
Chaplain rounded the unit and provided compassionate support through silent prayer to the patient who appeared to be sleeping. Joyce Horton [phone]

## 2015-12-27 LAB — BASIC METABOLIC PANEL
Anion gap: 8 (ref 5–15)
BUN: 23 mg/dL — AB (ref 6–20)
CHLORIDE: 96 mmol/L — AB (ref 101–111)
CO2: 26 mmol/L (ref 22–32)
CREATININE: 0.58 mg/dL (ref 0.44–1.00)
Calcium: 8.8 mg/dL — ABNORMAL LOW (ref 8.9–10.3)
GFR calc Af Amer: >60 mL/min (ref >60)
GFR calc non Af Amer: >60 mL/min (ref >60)
Glucose, Bld: 124 mg/dL — ABNORMAL HIGH (ref 65–99)
POTASSIUM: 3.5 mmol/L (ref 3.5–5.1)
Sodium: 130 mmol/L — ABNORMAL LOW (ref 135–145)

## 2015-12-27 MED ORDER — PNEUMOCOCCAL VAC POLYVALENT 25 MCG/0.5ML IJ INJ: 0.5000 mL | INJECTION | INTRAMUSCULAR | Status: DC

## 2015-12-27 MED ORDER — DILTIAZEM HCL ER COATED BEADS 180 MG PO CP24: 180.0000 mg | ORAL_CAPSULE | Freq: Every day | ORAL | Status: AC

## 2015-12-27 MED ORDER — APIXABAN 5 MG PO TABS: 5.0000 mg | ORAL_TABLET | Freq: Two times a day (BID) | ORAL | Status: AC

## 2015-12-27 MED ORDER — PREDNISONE 20 MG PO TABS: 40.0000 mg | ORAL_TABLET | Freq: Every day | ORAL | Status: DC

## 2015-12-27 MED ORDER — METOPROLOL TARTRATE 100 MG PO TABS: 100.0000 mg | ORAL_TABLET | Freq: Two times a day (BID) | ORAL | Status: AC

## 2015-12-27 NOTE — Progress Notes (Signed)
[facility] Encounter Note  Patient: Joyce Horton / Admit Date: 12/24/2015 / Date of Encounter: 12/27/2015, 7:56 AM   Subjective: Atrial fibrillation with excellent heart rate control at this time on appropriate combination of medication management without evidence of further significant symptoms  Review of Systems: Positive for: Weakness and palpitation improving Negative for: Vision change, hearing change, syncope, dizziness, nausea, vomiting,diarrhea, bloody stool, stomach pain, cough, congestion, diaphoresis, urinary frequency, urinary pain,skin lesions, skin rashes Others previously listed  Objective: Telemetry: Atrial fibrillation with controlled ventricular rate Physical Exam: Blood pressure 107/73, pulse 89, temperature 97.8 F (36.6 C), temperature source Oral, resp. rate 18, height 5' (1.524 m), weight 180 lb 12.8 oz (82.01 kg), SpO2 95 %. Body mass index is 35.31 kg/(m^2). General: Well developed, well nourished, in no acute distress. Head: Normocephalic, atraumatic, sclera non-icteric, no xanthomas, nares are without discharge. Neck: No apparent masses Lungs: Normal respirations with some wheezes, no rhonchi, no rales , no crackles   Heart: Irregular rate and rhythm, normal S1 S2, no murmur, no rub, no gallop, PMI is normal size and placement, carotid upstroke normal without bruit, jugular venous pressure normal Abdomen: Soft, non-tender, non-distended with normoactive bowel sounds. No hepatosplenomegaly. Abdominal aorta is normal size without bruit Extremities: Trace edema, no clubbing, no cyanosis, no ulcers,  Peripheral: 2+ radial, 2+ femoral, 2+ dorsal pedal pulses Neuro: Alert and oriented. Moves all extremities spontaneously. Psych:  Responds to questions appropriately with a normal affect.   Intake/Output Summary (Last 24 hours) at 12/27/15 0756 Last data filed at 12/27/15 0326  Gross per 24 hour  Intake    753 ml  Output    350 ml  Net     403 ml    Inpatient Medications:  . apixaban  5 mg Oral BID  . aspirin  325 mg Oral Daily  . diltiazem  240 mg Oral Daily  . donepezil  10 mg Oral QHS  . FLUoxetine  20 mg Oral Daily  . metoprolol tartrate  100 mg Oral BID  . oxybutynin  5 mg Oral BID  . predniSONE  40 mg Oral Q breakfast  . sodium chloride flush  3 mL Intravenous Q12H  . sodium chloride flush  3 mL Intravenous Q12H   Infusions:    Labs:  Recent Labs  12/25/15 0843  12/26/15 0448 12/27/15 0635  NA  --   < > 133* 130*  K  --   < > 3.5 3.5  CL  --   < > 99* 96*  CO2  --   < > 25 26  GLUCOSE  --   < > 115* 124*  BUN  --   < > 27* 23*  CREATININE  --   < > 0.62 0.58  CALCIUM  --   < > 9.0 8.8*  MG 1.7  --  2.0  --   PHOS  --   --  2.6  --   < > = values in this interval not displayed. No results for input(s): AST, ALT, ALKPHOS, BILITOT, PROT, ALBUMIN in the last 72 hours.  Recent Labs  12/24/15 1158 12/25/15 0843  WBC 11.3* 11.8*  HGB 9.5* 9.9*  HCT 30.0* 30.5*  MCV 77.7* 77.0*  PLT 470* 450*    Recent Labs  12/24/15 1158 12/25/15 0843 12/25/15 1446 12/25/15 1902  TROPONINI <0.03 <0.03 <0.03 <0.03   Invalid input(s): POCBNP No results for input(s): HGBA1C in the last 72 hours.   Weights: [employer]  12/26/15 1956 12/26/15 2002 12/27/15 0416  Weight: 186 lb 3.2 oz (84.46 kg) 182 lb 1.6 oz (82.6 kg) 180 lb 12.8 oz (82.01 kg)     Radiology/Studies:  Dg Chest 2 View  12/24/2015  CLINICAL DATA:  Short of breath.  Atrial fibrillation EXAM: CHEST  2 VIEW COMPARISON:  04/11/2012 FINDINGS: Cardiac enlargement. Negative for heart failure. Negative for infiltrate effusion or mass. Moderate to severe compression fracture in the lower thoracic spine, not present 02/25/2010. Indeterminate age. IMPRESSION: No active cardiopulmonary disease. Electronically Signed   By: Franchot Gallo M.D.   On: 12/24/2015 12:45     Assessment and Recommendation  75 y.o. female with essential hypertension mixed  hyperlipidemia and new onset atrial fibrillation with rapid ventricular rate now better controlled on metoprolol and diltiazem combination although not converted to normal sinus rhythm with multiple causes. The patient has had no evidence of myocardial infarction and or congestive heart failure at this time 1. No change in dosage of medication management at this time well controlled but not spontaneously converted to normal sinus rhythm 2. Continue Eliquis 5 mg twice per day for further risk reduction in stroke with atrial fibrillation 4. Begin ambulation for adjustments of medication management as necessary 5. No further cardiac diagnostics necessary at this time with the possible discharge to home and further follow-up with adjustments of medications as outpatient later today  Signed, Serafina Royals M.D. FACC

## 2015-12-27 NOTE — Care Management Important Message (Signed)
Important Message  Patient Details  Name: Joyce Horton MRN: [id number] Date of Birth: [birth year]   Medicare Important Message Given:  Yes    [name] Fread 12/27/2015, 11:16 AM

## 2015-12-27 NOTE — Progress Notes (Signed)
Pt alert and oriented x4, no complaints of pain or discomfort.  Bed in low position, call bell within reach.  Bed alarms on and functioning.  Assessment done and charted.  Will continue to monitor and do hourly rounding throughout the shift 

## 2015-12-27 NOTE — Progress Notes (Signed)
Discharge: Pt d/c from room via wheelchair, Family member with the pt. Discharge instructions given to the patient and family members.  No questions from pt, reintegrated to the pt to call or go to the ED for chest discomfort. Pt dressed in street clothes and left with discharge papers and prescriptions in hand. IV d/ced, tele removed and no complaints of pain or discomfort. 

## 2015-12-27 NOTE — Care Management (Signed)
Patient is for discharge home today on new eliquis.  Provided 30 day coupon.  Says she has pharmacy coverage with her medicare plan.  Lives with her son and his 2 teenagers.  Independent in all her adls. She declines need for home health nursing follow up.  Has been up independently in her room

## 2015-12-27 NOTE — Discharge Summary (Signed)
[name] NAME: Joyce Horton    MR#:  [id number]  DATE OF BIRTH:  February 01, 1941  DATE OF ADMISSION:  12/24/2015 ADMITTING PHYSICIAN: Hillary Bow, MD  DATE OF DISCHARGE: 12/27/2015 12:26 PM  PRIMARY CARE PHYSICIAN: SPARKS,JEFFREY D, MD   ADMISSION DIAGNOSIS:  Atrial fibrillation with RVR ([place]) [I48.91]  DISCHARGE DIAGNOSIS:  Active Problems:   A-fib ([place])   SECONDARY DIAGNOSIS:   Past Medical History  Diagnosis Date  . Hypertension   . Ulcer   . Osteoporosis   . Obesity   . History of chemotherapy   . Anemia   . GERD (gastroesophageal reflux disease)   . Fall 2012  . 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 Neoadjuvant chemotherapy. Wide local excision, sentinel node biopsy completed May 2011 followed by whole breast radiation.     ADMITTING HISTORY  Joyce Horton is a 75 y.o. female with a known history of Hypertension, dementia, obesity presents to the emergency room with 1 week off tachycardia. Patient has noticed dizziness, weakness and shortness of breath for a week. Son checked her heart rate at home with her blood pressure machine and heart rate has been fluctuating between 120-155. She did not want to come to the emergency room and today she had a routine follow-up with her primary care physician and has been sent to the emergency room after heart rate was noticed to be 155 with atrial fibrillation. Here patient has been given a Cardizem bolus in spite of which a heartrate is elevated into the 140s and the hospitalist team has been consult. No history of atrial fibrillation. Patient is on Toprol-XL 50 mg daily for her hypertension. No melena or stool in the blood. No hematuria. No recent falls. Not an anticoagulation at home. No thyroid problems.  HOSPITAL COURSE:   # A. fib, RVR, new onset - HR still uncontrolled and tachycardic On Metoprolol from home with  increased dose. Oral Cardizem was added as patient continued to be tachycardic on increased dose of metoprolol. Started Eliquis Discussed with Dr. Nehemiah Massed. Patient is being discharged home in stable condition to follow-up with cardiology and primary care physician in one week.  # Acute bronchitis- improved Started Prednsione. Continue 4 more days after discharge.  # Mild chronic Hyponatremia Stable  # Hypokalemia Replaced  # Hypomagnesemia Supplemented  # Dementia  CONSULTS OBTAINED:  Treatment Team:  Corey Skains, MD  DRUG ALLERGIES:   Allergies  Allergen Reactions  . Fentanyl Other (See Comments)    Hallucinations and mental alterations    DISCHARGE MEDICATIONS:   Discharge Medication List as of 12/27/2015 12:15 PM    START taking these medications   Details  apixaban (ELIQUIS) 5 MG TABS tablet Take 1 tablet (5 mg total) by mouth 2 (two) times daily., Starting 12/27/2015, Until Discontinued, Normal    diltiazem (CARDIZEM CD) 180 MG 24 hr capsule Take 1 capsule (180 mg total) by mouth daily., Starting 12/27/2015, Until Discontinued, Normal    metoprolol (LOPRESSOR) 100 MG tablet Take 1 tablet (100 mg total) by mouth 2 (two) times daily., Starting 12/27/2015, Until Discontinued, Normal    predniSONE (DELTASONE) 20 MG tablet Take 2 tablets (40 mg total) by mouth daily with breakfast., Starting 12/27/2015, Until Discontinued, Normal      CONTINUE these medications which have NOT CHANGED   Details  acetaminophen (TYLENOL) 500 MG tablet Take 500 mg by mouth every 6 (six)  hours as needed., Until Discontinued, Historical Med    ALPRAZolam (XANAX) 0.25 MG tablet Take 0.25 mg by mouth at bedtime as needed for anxiety., Until Discontinued, Historical Med    aspirin 325 MG tablet Take 325 mg by mouth daily., Until Discontinued, Historical Med    Calcium-Vitamin D (CALTRATE 600 PLUS-VIT D PO) Take 1 tablet by mouth daily. , Until Discontinued, Historical Med     cetirizine (ZYRTEC) 10 MG chewable tablet Chew 10 mg by mouth as needed for allergies., Until Discontinued, Historical Med    donepezil (ARICEPT) 10 MG tablet Take 10 mg by mouth at bedtime., Until Discontinued, Historical Med    FLUoxetine (PROZAC) 20 MG tablet Take 20 mg by mouth daily., Until Discontinued, Historical Med    Multiple Vitamins-Minerals (MULTIVITAMIN WITH MINERALS) tablet Take 1 tablet by mouth daily., Until Discontinued, Historical Med    naproxen sodium (ANAPROX) 220 MG tablet Take 220 mg by mouth 2 (two) times daily as needed., Until Discontinued, Historical Med    oxybutynin (DITROPAN) 5 MG tablet Take 5 mg by mouth 2 (two) times daily., Until Discontinued, Historical Med      STOP taking these medications     lisinopril-hydrochlorothiazide (PRINZIDE,ZESTORETIC) 20-25 MG per tablet      metoprolol succinate (TOPROL-XL) 50 MG 24 hr tablet      Nebivolol HCl (BYSTOLIC PO)         Today   VITAL SIGNS:  Blood pressure 113/76, pulse 96, temperature 97.8 F (36.6 C), temperature source Oral, resp. rate 18, height 5' (1.524 m), weight 82.01 kg (180 lb 12.8 oz), SpO2 100 %.  I/O:   Intake/Output Summary (Last 24 hours) at 12/27/15 1448 Last data filed at 12/27/15 0326  Gross per 24 hour  Intake    200 ml  Output    350 ml  Net   -150 ml    PHYSICAL EXAMINATION:  Physical Exam  GENERAL:  76 y.o.-year-old patient lying in the bed with no acute distress.  LUNGS: Normal breath sounds bilaterally, no wheezing, rales,rhonchi or crepitation. No use of accessory muscles of respiration.  CARDIOVASCULAR: S1, S2 Irregular. No tachycardia ABDOMEN: Soft, non-tender, non-distended. Bowel sounds present. No organomegaly or mass.  NEUROLOGIC: Moves all 4 extremities. PSYCHIATRIC: The patient is alert and awake. Pleasantly confused SKIN: No obvious rash, lesion, or ulcer.   DATA REVIEW:   CBC  Recent Labs Lab 12/25/15 0843  WBC 11.8*  HGB 9.9*  HCT 30.5*   PLT 450*    Chemistries   Recent Labs Lab 12/26/15 0448 12/27/15 0635  NA 133* 130*  K 3.5 3.5  CL 99* 96*  CO2 25 26  GLUCOSE 115* 124*  BUN 27* 23*  CREATININE 0.62 0.58  CALCIUM 9.0 8.8*  MG 2.0  --     Cardiac Enzymes  Recent Labs Lab 12/25/15 1902  TROPONINI <0.03    Microbiology Results  Results for orders placed or performed during the hospital encounter of 12/24/15  MRSA PCR Screening     Status: None   Collection Time: 12/24/15  5:21 PM  Result Value Ref Range Status   MRSA by PCR NEGATIVE NEGATIVE Final    Comment:        The GeneXpert MRSA Assay (FDA approved for NASAL specimens only), is one component of a comprehensive MRSA colonization surveillance program. It is not intended to diagnose MRSA infection nor to guide or monitor treatment for MRSA infections.     RADIOLOGY:  No results found.  Follow  up with PCP in 1 week.  Management plans discussed with the patient, family and they are in agreement.  CODE STATUS:     Code Status Orders        Start     Ordered   12/24/15 1515  Full code   Continuous     12/24/15 1515    Code Status History    Date Active Date Inactive Code Status Order ID Comments User Context   This patient has a current code status but no historical code status.    Advance Directive Documentation        Most Recent Value   Type of Advance Directive  Healthcare Power of [facility]]   Pre-existing out of facility DNR order (yellow form or pink MOST form)     "MOST" Form in Place?        TOTAL TIME TAKING CARE OF THIS PATIENT ON DAY OF DISCHARGE: more than 30 minutes.   Hillary Bow R M.D on 12/27/2015 at 2:48 PM  Between 7am to 6pm - Pager - [phone]  After 6pm go to [email or web address] - password [email or web address] [facility]  Office  [phone]  CC: Primary care physician; Idelle Crouch, MD  Note: This dictation was prepared with Dragon dictation along with smaller  phrase technology. Any transcriptional errors that result from this process are unintentional.

## 2015-12-27 NOTE — Discharge Instructions (Addendum)
DIET:  Cardiac diet  DISCHARGE CONDITION:  Stable  ACTIVITY:  Activity as tolerated  OXYGEN:  Home Oxygen: No.   Oxygen Delivery: room air  DISCHARGE LOCATION:  home   If you experience worsening of your admission symptoms, develop shortness of breath, life threatening emergency, suicidal or homicidal thoughts you must seek medical attention immediately by calling 911 or calling your MD immediately  if symptoms less severe.  You Must read complete instructions/literature along with all the possible adverse reactions/side effects for all the Medicines you take and that have been prescribed to you. Take any new Medicines after you have completely understood and accpet all the possible adverse reactions/side effects.   Please note  You were cared for by a hospitalist during your hospital stay. If you have any questions about your discharge medications or the care you received while you were in the hospital after you are discharged, you can call the unit and asked to speak with the hospitalist on call if the hospitalist that took care of you is not available. Once you are discharged, your primary care physician will handle any further medical issues. Please note that NO REFILLS for any discharge medications will be authorized once you are discharged, as it is imperative that you return to your primary care physician (or establish a relationship with a primary care physician if you do not have one) for your aftercare needs so that they can reassess your need for medications and monitor your lab values.   Information on my medicine - ELIQUIS (apixaban)  This medication education was reviewed with me or my healthcare representative as part of my discharge preparation.  The pharmacist that spoke with me during my hospital stay was:  Ramond Dial, [facility]  Why was Eliquis prescribed for you? Eliquis was prescribed for you to reduce the risk of a blood clot forming that can cause a stroke  if you have a medical condition called atrial fibrillation (a type of irregular heartbeat).  What do You need to know about Eliquis ? Take your Eliquis TWICE DAILY - one tablet in the morning and one tablet in the evening with or without food. If you have difficulty swallowing the tablet whole please discuss with your pharmacist how to take the medication safely.  Take Eliquis exactly as prescribed by your doctor and DO NOT stop taking Eliquis without talking to the doctor who prescribed the medication.  Stopping may increase your risk of developing a stroke.  Refill your prescription before you run out.  After discharge, you should have regular check-up appointments with your healthcare provider that is prescribing your Eliquis.  In the future your dose may need to be changed if your kidney function or weight changes by a significant amount or as you get older.  What do you do if you miss a dose? If you miss a dose, take it as soon as you remember on the same day and resume taking twice daily.  Do not take more than one dose of ELIQUIS at the same time to make up a missed dose.  Important Safety Information A possible side effect of Eliquis is bleeding. You should call your healthcare provider right away if you experience any of the following: ? Bleeding from an injury or your nose that does not stop. ? Unusual colored urine (red or dark brown) or unusual colored stools (red or black). ? Unusual bruising for unknown reasons. ? A serious fall or if you hit your head (even  if there is no bleeding).  Some medicines may interact with Eliquis and might increase your risk of bleeding or clotting while on Eliquis. To help avoid this, consult your healthcare provider or pharmacist prior to using any new prescription or non-prescription medications, including herbals, vitamins, non-steroidal anti-inflammatory drugs (NSAIDs) and supplements.  This website has more information on Eliquis  (apixaban): http://www.eliquis.com/eliquis/home

## 2015-12-27 NOTE — Progress Notes (Signed)
Pharmacy Consult for Electrolyte Replacement   Allergies  Allergen Reactions  . Fentanyl Other (See Comments)    Hallucinations and mental alterations    Patient Measurements: Height: 5' (152.4 cm) Weight: 180 lb 12.8 oz (82.01 kg) IBW/kg (Calculated) : 45.5  Vital Signs: Temp: 97.8 F (36.6 C) (03/23 0821) Temp Source: Oral (03/23 0821) BP: 113/76 mmHg (03/23 0821) Pulse Rate: 96 (03/23 0836) Intake/Output from previous day: 03/22 0701 - 03/23 0700 In: 753 [P.O.:750; I.V.:3] Out: 350 [Urine:350] Intake/Output from this shift:    Labs:  Recent Labs  12/24/15 1158 12/24/15 1159 12/24/15 1650 12/25/15 0843  WBC 11.3*  --   --  11.8*  HGB 9.5*  --   --  9.9*  HCT 30.0*  --   --  30.5*  PLT 470*  --   --  450*  APTT  --   --  25  --   INR  --  1.16  --   --      Recent Labs  12/24/15 1159  12/25/15 0843 12/25/15 1902 12/26/15 0448 12/27/15 0635  NA  --   < >  --  132* 133* 130*  K  --   < >  --  3.7 3.5 3.5  CL  --   < >  --  97* 99* 96*  CO2  --   < >  --  25 25 26   GLUCOSE  --   < >  --  130* 115* 124*  BUN  --   < >  --  27* 27* 23*  CREATININE  --   < >  --  0.94 0.62 0.58  CALCIUM  --   < >  --  8.9 9.0 8.8*  MG 1.5*  --  1.7  --  2.0  --   PHOS  --   --   --   --  2.6  --   < > = values in this interval not displayed. Estimated Creatinine Clearance: 56.8 mL/min (by C-G formula based on Cr of 0.58).    Recent Labs  12/24/15 1721  GLUCAP 111*    Medical History: Past Medical History  Diagnosis Date  . Hypertension   . Ulcer   . Osteoporosis   . Obesity   . History of chemotherapy   . Anemia   . GERD (gastroesophageal reflux disease)   . Fall 2012  . 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 Neoadjuvant chemotherapy. Wide local excision, sentinel node biopsy completed May 2011 followed by whole breast radiation.    Medications:  Scheduled:  . apixaban  5 mg Oral BID  . aspirin  325 mg  Oral Daily  . diltiazem  240 mg Oral Daily  . donepezil  10 mg Oral QHS  . FLUoxetine  20 mg Oral Daily  . metoprolol tartrate  100 mg Oral BID  . oxybutynin  5 mg Oral BID  . [START ON 12/28/2015] pneumococcal 23 valent vaccine  0.5 mL Intramuscular Tomorrow-1000  . predniSONE  40 mg Oral Q breakfast  . sodium chloride flush  3 mL Intravenous Q12H  . sodium chloride flush  3 mL Intravenous Q12H   Infusions:    Assessment: Pharmacy consulted to assist in managing electrolytes in this 75 y/o F with new-onset atrial fibrillation.   Plan:  Electrolytes are WNL. No supplementation needed. Pt to d/c home today  Ramond Dial, PharmD Clinical Pharmacist 12/27/2015,10:12 AM

## 2016-03-20 ENCOUNTER — Other Ambulatory Visit: Payer: Self-pay | Admitting: Internal Medicine

## 2016-03-20 DIAGNOSIS — R479 Unspecified speech disturbances: Secondary | ICD-10-CM

## 2016-04-24 ENCOUNTER — Ambulatory Visit
Admission: RE | Admit: 2016-04-24 | Discharge: 2016-04-24 | Disposition: A | Discharge status: Home / Self Care | Payer: Medicare Other | Source: Ambulatory Visit | Attending: Internal Medicine | Admitting: Internal Medicine

## 2016-04-24 DIAGNOSIS — G319 Degenerative disease of nervous system, unspecified: Secondary | ICD-10-CM | POA: Diagnosis not present

## 2016-04-24 DIAGNOSIS — I6782 Cerebral ischemia: Secondary | ICD-10-CM | POA: Diagnosis not present

## 2016-04-24 DIAGNOSIS — R479 Unspecified speech disturbances: Secondary | ICD-10-CM | POA: Diagnosis not present

## 2016-04-24 DIAGNOSIS — R600 Localized edema: Secondary | ICD-10-CM | POA: Diagnosis not present

## 2016-04-24 DIAGNOSIS — J323 Chronic sphenoidal sinusitis: Secondary | ICD-10-CM | POA: Insufficient documentation

## 2016-07-07 NOTE — Progress Notes (Deleted)
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  INTERVAL HISTORY: Patient returns to clinic today for laboratory work and routine six-month evaluation. Her son reports that her dementia and memory are slightly worse. She otherwise has been well and is asymptomatic. She denies any fevers, chills, or night sweats.  She has a good appetite and denies weight loss.  She denies any chest pain, shortness of breath, or hemoptysis.  She denies any nausea, vomiting, constipation, or diarrhea.  She has no urinary complaints.  Patient offers no further specific complaints today.  REVIEW OF SYSTEMS:   Review of Systems  Constitutional: Negative for fever and malaise/fatigue.  Respiratory: Negative.   Cardiovascular: Negative.   Gastrointestinal: Negative.   Musculoskeletal: Negative.   Neurological: Negative.  Negative for weakness.  Psychiatric/Behavioral: Positive for memory loss.    As per HPI. Otherwise, a complete review of systems is negatve.  PAST MEDICAL HISTORY: Past Medical History:  Diagnosis Date  . Anemia   . Cancer [facility]) October 2010   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. Neoadjuvant chemotherapy. Wide local excision, sentinel node biopsy completed May 2011 followed by whole breast radiation.  . Fall 2012  . GERD (gastroesophageal reflux disease)   . History of chemotherapy   . Hypertension   . Obesity   . Osteoporosis   . Ulcer ([place])     PAST SURGICAL HISTORY: Past Surgical History:  Procedure Laterality Date  . BREAST BIOPSY  2000,2007,2010   left breast 2010  . BREAST SURGERY Left 2011   lumpectomy  . COLONOSCOPY  2002,2007,2010   [place]  . SKIN LESION EXCISION  [id number]  . stents     R leg  . TIBIA FRACTURE SURGERY Right July 2013   right femur fracture with internal fixation.    FAMILY HISTORY Family History  Problem Relation Age of Onset  . Cancer Mother     breast  . CAD Father        ADVANCED DIRECTIVES:    HEALTH MAINTENANCE: Social History  Substance Use Topics  . Smoking status: Never Smoker  . Smokeless tobacco: Never Used  . Alcohol use 0.0 oz/week     Colonoscopy:  PAP:  Bone density:  Lipid panel:  Allergies  Allergen Reactions  . Fentanyl Other (See Comments)    Hallucinations and mental alterations    Current Outpatient Prescriptions  Medication Sig Dispense Refill  . acetaminophen (TYLENOL) 500 MG tablet Take 500 mg by mouth every 6 (six) hours as needed.    . ALPRAZolam (XANAX) 0.25 MG tablet Take 0.25 mg by mouth at bedtime as needed for anxiety.    Marland Kitchen apixaban (ELIQUIS) 5 MG TABS tablet Take 1 tablet (5 mg total) by mouth 2 (two) times daily. 180 tablet 0  . aspirin 325 MG tablet Take 325 mg by mouth daily.    . Calcium-Vitamin D (CALTRATE 600 PLUS-VIT D PO) Take 1 tablet by mouth daily.     . cetirizine (ZYRTEC) 10 MG chewable tablet Chew 10 mg by mouth as needed for allergies.    Marland Kitchen diltiazem (CARDIZEM CD) 180 MG 24 hr capsule Take 1 capsule (180 mg total) by mouth daily. 90 capsule 0  .  donepezil (ARICEPT) 10 MG tablet Take 10 mg by mouth at bedtime.  12  . FLUoxetine (PROZAC) 20 MG tablet Take 20 mg by mouth daily.    . metoprolol (LOPRESSOR) 100 MG tablet Take 1 tablet (100 mg total) by mouth 2 (two) times daily. 60 tablet 0  . Multiple Vitamins-Minerals (MULTIVITAMIN WITH MINERALS) tablet Take 1 tablet by mouth daily.    . naproxen sodium (ANAPROX) 220 MG tablet Take 220 mg by mouth 2 (two) times daily as needed.    Marland Kitchen oxybutynin (DITROPAN) 5 MG tablet Take 5 mg by mouth 2 (two) times daily.    . predniSONE (DELTASONE) 20  MG tablet Take 2 tablets (40 mg total) by mouth daily with breakfast. 4 tablet 0   No current facility-administered medications for this visit.     OBJECTIVE: There were no vitals filed for this visit.   There is no height or weight on file to calculate BMI.    ECOG FS:0 - Asymptomatic  General: Well-developed, well-nourished, no acute distress. Eyes: Pink conjunctiva, anicteric sclera. Breasts: Bilateral breast and axilla without lumps or masses. Lungs: Clear to auscultation bilaterally. Heart: Regular rate and rhythm. No rubs, murmurs, or gallops. Abdomen: Soft, nontender, nondistended. No organomegaly noted, normoactive bowel sounds. Musculoskeletal: No edema, cyanosis, or clubbing. Neuro: Alert, answering all questions appropriately. Cranial nerves grossly intact. Skin: No rashes or petechiae noted. Psych: Normal affect.   LAB RESULTS:  Lab Results  Component Value Date   NA 130 (L) 12/27/2015   K 3.5 12/27/2015   CL 96 (L) 12/27/2015   CO2 26 12/27/2015   GLUCOSE 124 (H) 12/27/2015   BUN 23 (H) 12/27/2015   CREATININE 0.58 12/27/2015   CALCIUM 8.8 (L) 12/27/2015   PROT 6.6 12/28/2014   ALBUMIN 4.0 12/28/2014   AST 20 12/28/2014   ALT 12 (L) 12/28/2014   ALKPHOS 62 12/28/2014   BILITOT 0.5 12/28/2014   GFRNONAA >60 12/27/2015   GFRAA >60 12/27/2015    Lab Results  Component Value Date   WBC 11.8 (H) 12/25/2015   NEUTROABS 7.0 (H) 12/28/2014   HGB 9.9 (L) 12/25/2015   HCT 30.5 (L) 12/25/2015   MCV 77.0 (L) 12/25/2015   PLT 450 (H) 12/25/2015     STUDIES: No results found.  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  PLAN:    1.  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 She completed her adjuvant XRT in August of 2011. Mammogram in October 2015 was reported as BI-RADS 2, repeat in the next several [place].  Patient's CA 27-29  continues to be elevated at 44.1, but essentially unchanged.  No intervention is needed at this time. Patient is now 5 years removed from completing her treatments and can be switched to yearly visits.  2. Memory loss/dementia: Unrelated to her underlying history of breast cancer. Monitor.  Patient expressed understanding and was in agreement with this plan. She also understands that She can call clinic at any time with any questions, concerns, or complaints.   Lloyd Huger, MD   07/07/2016 2:30 PM

## 2016-07-08 ENCOUNTER — Ambulatory Visit
Admission: RE | Admit: 2016-07-08 | Discharge: 2016-07-08 | Disposition: A | Discharge status: Home / Self Care | Payer: Medicare Other | Source: Ambulatory Visit | Attending: Internal Medicine | Admitting: Internal Medicine

## 2016-07-08 ENCOUNTER — Other Ambulatory Visit: Payer: Self-pay | Admitting: Internal Medicine

## 2016-07-08 DIAGNOSIS — R29890 Loss of height: Secondary | ICD-10-CM | POA: Insufficient documentation

## 2016-07-08 DIAGNOSIS — X58XXXA Exposure to other specified factors, initial encounter: Secondary | ICD-10-CM | POA: Insufficient documentation

## 2016-07-08 DIAGNOSIS — S22000A Wedge compression fracture of unspecified thoracic vertebra, initial encounter for closed fracture: Secondary | ICD-10-CM

## 2016-07-09 ENCOUNTER — Inpatient Hospital Stay: Payer: Medicare Other | Admitting: Oncology

## 2016-07-09 ENCOUNTER — Inpatient Hospital Stay: Payer: Medicare Other

## 2016-07-10 ENCOUNTER — Encounter
Admission: RE | Disposition: A | Discharge status: Home / Self Care | Payer: Self-pay | Source: Ambulatory Visit | Attending: Orthopedic Surgery

## 2016-07-10 ENCOUNTER — Ambulatory Visit: Payer: Medicare Other | Admitting: Anesthesiology

## 2016-07-10 ENCOUNTER — Ambulatory Visit
Admission: RE | Admit: 2016-07-10 | Discharge: 2016-07-10 | Disposition: A | Discharge status: Home / Self Care | Payer: Medicare Other | Source: Ambulatory Visit | Attending: Orthopedic Surgery | Admitting: Orthopedic Surgery

## 2016-07-10 ENCOUNTER — Ambulatory Visit: Payer: Medicare Other

## 2016-07-10 ENCOUNTER — Encounter: Payer: Self-pay | Admitting: *Deleted

## 2016-07-10 DIAGNOSIS — S22080A Wedge compression fracture of T11-T12 vertebra, initial encounter for closed fracture: Secondary | ICD-10-CM | POA: Diagnosis present

## 2016-07-10 DIAGNOSIS — H3530 Unspecified macular degeneration: Secondary | ICD-10-CM | POA: Insufficient documentation

## 2016-07-10 DIAGNOSIS — I10 Essential (primary) hypertension: Secondary | ICD-10-CM | POA: Diagnosis not present

## 2016-07-10 DIAGNOSIS — M810 Age-related osteoporosis without current pathological fracture: Secondary | ICD-10-CM | POA: Insufficient documentation

## 2016-07-10 DIAGNOSIS — F329 Major depressive disorder, single episode, unspecified: Secondary | ICD-10-CM | POA: Diagnosis not present

## 2016-07-10 DIAGNOSIS — E785 Hyperlipidemia, unspecified: Secondary | ICD-10-CM | POA: Insufficient documentation

## 2016-07-10 DIAGNOSIS — Z7901 Long term (current) use of anticoagulants: Secondary | ICD-10-CM | POA: Insufficient documentation

## 2016-07-10 DIAGNOSIS — Z85828 Personal history of other malignant neoplasm of skin: Secondary | ICD-10-CM | POA: Diagnosis not present

## 2016-07-10 DIAGNOSIS — W010XXA Fall on same level from slipping, tripping and stumbling without subsequent striking against object, initial encounter: Secondary | ICD-10-CM | POA: Insufficient documentation

## 2016-07-10 DIAGNOSIS — I878 Other specified disorders of veins: Secondary | ICD-10-CM | POA: Insufficient documentation

## 2016-07-10 DIAGNOSIS — M1990 Unspecified osteoarthritis, unspecified site: Secondary | ICD-10-CM | POA: Insufficient documentation

## 2016-07-10 DIAGNOSIS — Y92012 Bathroom of single-family (private) house as the place of occurrence of the external cause: Secondary | ICD-10-CM | POA: Diagnosis not present

## 2016-07-10 DIAGNOSIS — Y93E1 Activity, personal bathing and showering: Secondary | ICD-10-CM | POA: Insufficient documentation

## 2016-07-10 DIAGNOSIS — K219 Gastro-esophageal reflux disease without esophagitis: Secondary | ICD-10-CM | POA: Insufficient documentation

## 2016-07-10 DIAGNOSIS — E669 Obesity, unspecified: Secondary | ICD-10-CM | POA: Diagnosis not present

## 2016-07-10 DIAGNOSIS — Y998 Other external cause status: Secondary | ICD-10-CM | POA: Diagnosis not present

## 2016-07-10 DIAGNOSIS — D649 Anemia, unspecified: Secondary | ICD-10-CM | POA: Insufficient documentation

## 2016-07-10 DIAGNOSIS — F0390 Unspecified dementia without behavioral disturbance: Secondary | ICD-10-CM | POA: Diagnosis not present

## 2016-07-10 DIAGNOSIS — Z6837 Body mass index (BMI) 37.0-37.9, adult: Secondary | ICD-10-CM | POA: Diagnosis not present

## 2016-07-10 DIAGNOSIS — Z419 Encounter for procedure for purposes other than remedying health state, unspecified: Secondary | ICD-10-CM

## 2016-07-10 HISTORY — PX: KYPHOPLASTY: SHX5884

## 2016-07-10 SURGERY — KYPHOPLASTY
Anesthesia: General

## 2016-07-10 MED ORDER — METOCLOPRAMIDE HCL 10 MG PO TABS: 5.0000 mg | ORAL_TABLET | Freq: Three times a day (TID) | ORAL | Status: DC | PRN

## 2016-07-10 MED ORDER — CEFAZOLIN SODIUM-DEXTROSE 2-4 GM/100ML-% IV SOLN
INTRAVENOUS | Status: AC
  Filled 2016-07-10: qty 100

## 2016-07-10 MED ORDER — TRANEXAMIC ACID 1000 MG/10ML IV SOLN
INTRAVENOUS | Status: DC | PRN
  Administered 2016-07-10: 1000 mg via TOPICAL

## 2016-07-10 MED ORDER — HYDROCODONE-ACETAMINOPHEN 5-325 MG PO TABS: 1.0000 | ORAL_TABLET | ORAL | Status: DC | PRN

## 2016-07-10 MED ORDER — SODIUM CHLORIDE 0.9 % IV SOLN: INTRAVENOUS | Status: DC

## 2016-07-10 MED ORDER — ONDANSETRON HCL 4 MG/2ML IJ SOLN: 4.0000 mg | Freq: Four times a day (QID) | INTRAMUSCULAR | Status: DC | PRN

## 2016-07-10 MED ORDER — IOPAMIDOL (ISOVUE-M 200) INJECTION 41%
INTRAMUSCULAR | Status: AC
  Filled 2016-07-10: qty 10

## 2016-07-10 MED ORDER — PHENYLEPHRINE HCL 10 MG/ML IJ SOLN
INTRAMUSCULAR | Status: DC | PRN
  Administered 2016-07-10: 150 ug via INTRAVENOUS
  Administered 2016-07-10: 100 ug via INTRAVENOUS

## 2016-07-10 MED ORDER — PROPOFOL 10 MG/ML IV BOLUS
INTRAVENOUS | Status: DC | PRN
  Administered 2016-07-10: 30 mg via INTRAVENOUS

## 2016-07-10 MED ORDER — KETAMINE HCL 50 MG/ML IJ SOLN
INTRAMUSCULAR | Status: DC | PRN
  Administered 2016-07-10: 40 mg via INTRAMUSCULAR

## 2016-07-10 MED ORDER — LIDOCAINE HCL (CARDIAC) 20 MG/ML IV SOLN
INTRAVENOUS | Status: DC | PRN
  Administered 2016-07-10: 50 mg via INTRAVENOUS

## 2016-07-10 MED ORDER — ONDANSETRON HCL 4 MG PO TABS: 4.0000 mg | ORAL_TABLET | Freq: Four times a day (QID) | ORAL | Status: DC | PRN

## 2016-07-10 MED ORDER — METOCLOPRAMIDE HCL 5 MG/ML IJ SOLN: 5.0000 mg | Freq: Three times a day (TID) | INTRAMUSCULAR | Status: DC | PRN

## 2016-07-10 MED ORDER — BUPIVACAINE-EPINEPHRINE (PF) 0.5% -1:200000 IJ SOLN
INTRAMUSCULAR | Status: DC | PRN
  Administered 2016-07-10: 10 mL via PERINEURAL

## 2016-07-10 MED ORDER — LIDOCAINE HCL (PF) 1 % IJ SOLN
INTRAMUSCULAR | Status: AC
  Filled 2016-07-10: qty 30

## 2016-07-10 MED ORDER — HYDROCODONE-ACETAMINOPHEN 5-325 MG PO TABS: 1.0000 | ORAL_TABLET | Freq: Four times a day (QID) | ORAL | 0 refills | Status: DC | PRN

## 2016-07-10 MED ORDER — ACETAMINOPHEN 10 MG/ML IV SOLN
INTRAVENOUS | Status: AC
  Filled 2016-07-10: qty 100

## 2016-07-10 MED ORDER — LIDOCAINE HCL 1 % IJ SOLN
INTRAMUSCULAR | Status: DC | PRN
  Administered 2016-07-10: 20 mL

## 2016-07-10 MED ORDER — CEFAZOLIN SODIUM-DEXTROSE 2-4 GM/100ML-% IV SOLN
2.0000 g | Freq: Once | INTRAVENOUS | Status: AC
  Administered 2016-07-10: 2 g via INTRAVENOUS

## 2016-07-10 MED ORDER — HYDROMORPHONE HCL 1 MG/ML IJ SOLN: 0.2500 mg | INTRAMUSCULAR | Status: DC | PRN

## 2016-07-10 MED ORDER — PROPOFOL 500 MG/50ML IV EMUL
INTRAVENOUS | Status: DC | PRN
  Administered 2016-07-10: 70 ug/kg/min via INTRAVENOUS
  Administered 2016-07-10: 100 ug/kg/min via INTRAVENOUS

## 2016-07-10 MED ORDER — IOPAMIDOL (ISOVUE-M 200) INJECTION 41%
INTRAMUSCULAR | Status: DC | PRN
  Administered 2016-07-10: 20 mL

## 2016-07-10 MED ORDER — ONDANSETRON HCL 4 MG/2ML IJ SOLN: 4.0000 mg | Freq: Once | INTRAMUSCULAR | Status: DC | PRN

## 2016-07-10 MED ORDER — TRANEXAMIC ACID 1000 MG/10ML IV SOLN
INTRAVENOUS | Status: AC
  Filled 2016-07-10: qty 10

## 2016-07-10 MED ORDER — LACTATED RINGERS IV SOLN
INTRAVENOUS | Status: DC
  Administered 2016-07-10: 12:00:00 via INTRAVENOUS

## 2016-07-10 SURGICAL SUPPLY — 24 items
CEMENT KYPHON CX01A KIT/MIXER (Cement) ×3 IMPLANT
CUP MEDICINE 2OZ PLAST GRAD ST (MISCELLANEOUS) ×3 IMPLANT
DEVICE BIOPSY BONE KYPHX (INSTRUMENTS) ×3 IMPLANT
DRAPE C-ARM XRAY 36X54 (DRAPES) ×3 IMPLANT
DURAPREP 26ML APPLICATOR (WOUND CARE) ×3 IMPLANT
GLOVE BIO SURGEON STRL SZ 6.5 (GLOVE) ×4 IMPLANT
GLOVE BIO SURGEONS STRL SZ 6.5 (GLOVE) ×2
GLOVE BIOGEL PI IND STRL 7.0 (GLOVE) ×1 IMPLANT
GLOVE BIOGEL PI INDICATOR 7.0 (GLOVE) ×2
GLOVE INDICATOR 7.5 STRL GRN (GLOVE) ×3 IMPLANT
GLOVE SURG SYN 9.0  PF PI (GLOVE) ×2
GLOVE SURG SYN 9.0 PF PI (GLOVE) ×1 IMPLANT
GOWN SRG 2XL LVL 4 RGLN SLV (GOWNS) ×1 IMPLANT
GOWN STRL NON-REIN 2XL LVL4 (GOWNS) ×2
GOWN STRL REUS W/ TWL LRG LVL3 (GOWN DISPOSABLE) ×1 IMPLANT
GOWN STRL REUS W/TWL LRG LVL3 (GOWN DISPOSABLE) ×2
GOWN STRL REUS W/TWL MED LVL3 (GOWN DISPOSABLE) ×3 IMPLANT
KYPHON EXPRESS ×3 IMPLANT
LIQUID BAND (GAUZE/BANDAGES/DRESSINGS) ×3 IMPLANT
PACK KYPHOPLASTY (MISCELLANEOUS) ×3 IMPLANT
STRAP SAFETY BODY (MISCELLANEOUS) ×3 IMPLANT
TRAY KYPHOPAK 15/2 EXPRESS (KITS) ×3 IMPLANT
TRAY KYPHOPAK 15/3 EXPRESS 1ST (MISCELLANEOUS) IMPLANT
TRAY KYPHOPAK 20/3 EXPRESS 1ST (MISCELLANEOUS) IMPLANT

## 2016-07-10 NOTE — Anesthesia Preprocedure Evaluation (Signed)
Anesthesia Evaluation  Patient identified by MRN, date of birth, ID band Patient awake    Reviewed: Allergy & Precautions, NPO status , Patient's Chart, lab work & pertinent test results  History of Anesthesia Complications Negative for: history of anesthetic complications  Airway Mallampati: III       Dental   Pulmonary neg pulmonary ROS,           Cardiovascular hypertension, Pt. on medications and Pt. on home beta blockers      Neuro/Psych negative neurological ROS     GI/Hepatic GERD  Medicated,  Endo/Other    Renal/GU      Musculoskeletal   Abdominal   Peds  Hematology  (+) anemia ,   Anesthesia Other Findings   Reproductive/Obstetrics                             Anesthesia Physical Anesthesia Plan  ASA: III  Anesthesia Plan: General   Post-op Pain Management:    Induction: Intravenous  Airway Management Planned:   Additional Equipment:   Intra-op Plan:   Post-operative Plan:   Informed Consent: I have reviewed the patients History and Physical, chart, labs and discussed the procedure including the risks, benefits and alternatives for the proposed anesthesia with the patient or authorized representative who has indicated his/her understanding and acceptance.     Plan Discussed with:   Anesthesia Plan Comments:         Anesthesia Quick Evaluation

## 2016-07-10 NOTE — Transfer of Care (Signed)
Immediate Anesthesia Transfer of Care Note  Patient: Joyce Horton  Procedure(s) Performed: Procedure(s): KYPHOPLASTY  T 11 (N/A)  Patient Location: PACU  Anesthesia Type:General  Level of Consciousness: sedated  Airway & Oxygen Therapy: Patient Spontanous Breathing and Patient connected to nasal cannula oxygen  Post-op Assessment: Report given to RN and Post -op Vital signs reviewed and stable  Post vital signs: Reviewed and stable  Last Vitals:  Vitals:   07/10/16 1216 07/10/16 1530  Pulse: (!) 101   Resp: 16   Temp: (!) 35.9 C (P) 36.3 C    Last Pain:  Vitals:   07/10/16 1530  TempSrc:   PainSc: (P) Asleep         Complications: No apparent anesthesia complications

## 2016-07-10 NOTE — Anesthesia Postprocedure Evaluation (Signed)
Anesthesia Post Note  Patient: Joyce Horton  Procedure(s) Performed: Procedure(s) (LRB): KYPHOPLASTY  T 11 (N/A)  Patient location during evaluation: PACU Anesthesia Type: General Level of consciousness: awake and alert Pain management: pain level controlled Vital Signs Assessment: post-procedure vital signs reviewed and stable Respiratory status: spontaneous breathing and respiratory function stable Cardiovascular status: stable Anesthetic complications: no    Last Vitals:  Vitals:   07/10/16 1545 07/10/16 1550  BP: 97/64 (!) (P) 110/56  Pulse: 83   Resp: 19   Temp:      Last Pain:  Vitals:   07/10/16 1530  TempSrc:   PainSc: Asleep                 Charlott Calvario K

## 2016-07-10 NOTE — Discharge Instructions (Addendum)
Remove Band-Aid on Saturday, okay to shower after that. Minimal activities today and tomorrow and then resume normal activities on Saturday. Pain medicine as directed. Call if any problems to the office. Resume all normal medications with Eliquis to be started tomorrow morning AMBULATORY SURGERY  DISCHARGE INSTRUCTIONS   1) The drugs that you were given will stay in your system until tomorrow so for the next 24 hours you should not:  A) Drive an automobile B) Make any legal decisions C) Drink any alcoholic beverage   2) You may resume regular meals tomorrow.  Today it is better to start with liquids and gradually work up to solid foods.  You may eat anything you prefer, but it is better to start with liquids, then soup and crackers, and gradually work up to solid foods.   3) Please notify your doctor immediately if you have any unusual bleeding, trouble breathing, redness and pain at the surgery site, drainage, fever, or pain not relieved by medication.    4) Additional Instructions:        Please contact your physician with any problems or Same Day Surgery at [phone], Monday through Friday 6 am to 4 pm, or [place] at [facility] number at [phone].

## 2016-07-10 NOTE — H&P (Signed)
Reviewed paper H+P, will be scanned into chart. No changes noted.  

## 2016-07-10 NOTE — Op Note (Signed)
07/10/2016  3:32 PM  PATIENT:  Joyce Horton  75 y.o. female  PRE-OPERATIVE DIAGNOSIS:  compressionb fracture T 11  POST-OPERATIVE DIAGNOSIS:  compression fracture  T 11  PROCEDURE:  Procedure(s): KYPHOPLASTY  T 11 (N/A)  SURGEON: Laurene Footman, MD  ASSISTANTS: None  ANESTHESIA:   local and MAC  EBL:  Total I/O In: 500 [I.V.:500] Out: 5 [Blood:5]  BLOOD ADMINISTERED:none  DRAINS: none   LOCAL MEDICATIONS USED:  MARCAINE    and XYLOCAINE   SPECIMEN:  No Specimen  DISPOSITION OF SPECIMEN:  N/A  COUNTS:  YES  TOURNIQUET:  * No tourniquets in log *  IMPLANTS: Bone cement  DICTATION: .Dragon Dictation patient brought the operating room and after adequate sedation was given the patient was placed prone. C-arm was brought in and excellent visualization of T11 was obtained in both AP and lateral projections. After patient identification and timeout procedures were completed, local anesthetic was infiltrated on the right side to get local anesthetic to the skin a 1% Xylocaine with 10 cc after prepping with chlorhexidine. The back was then fully prepped and draped in a repeat timeout procedure carried out. Spinal needle was used on the right side to get local down to the pedicle and 20 cc of local injected from the bone to the subcutaneous layer. TXA was then infiltrated along the same tract 10 cc. After allowing this to set him a small incision was made and a trocar advanced in an extra pedicular fashion and biopsy of the T 11 vertebral body was attempted but not successful. Drilling was carried out and a balloon inserted and inflated to about 1-1/2  cc. Cement was mixed and when it was the appropriate consistency it was injected into the void without extravasation getting across the midline and there is adequate fill. After allowing the cement set the trochars removed and permanent C-arm views obtained. The wound was closed with Dermabond  PLAN OF CARE: Discharge to home after  PACU  PATIENT DISPOSITION:  PACU - hemodynamically stable.

## 2016-07-11 ENCOUNTER — Encounter: Payer: Self-pay | Admitting: Orthopedic Surgery

## 2016-07-20 ENCOUNTER — Emergency Department: Payer: Medicare Other

## 2016-07-20 ENCOUNTER — Emergency Department
Admission: EM | Admit: 2016-07-20 | Discharge: 2016-07-20 | Disposition: A | Discharge status: Home / Self Care | Payer: Medicare Other | Attending: Emergency Medicine | Admitting: Emergency Medicine

## 2016-07-20 DIAGNOSIS — S42212A Unspecified displaced fracture of surgical neck of left humerus, initial encounter for closed fracture: Secondary | ICD-10-CM | POA: Diagnosis not present

## 2016-07-20 DIAGNOSIS — Y929 Unspecified place or not applicable: Secondary | ICD-10-CM | POA: Diagnosis not present

## 2016-07-20 DIAGNOSIS — W1839XA Other fall on same level, initial encounter: Secondary | ICD-10-CM | POA: Insufficient documentation

## 2016-07-20 DIAGNOSIS — Y999 Unspecified external cause status: Secondary | ICD-10-CM | POA: Diagnosis not present

## 2016-07-20 DIAGNOSIS — Y9389 Activity, other specified: Secondary | ICD-10-CM | POA: Diagnosis not present

## 2016-07-20 DIAGNOSIS — S4992XA Unspecified injury of left shoulder and upper arm, initial encounter: Secondary | ICD-10-CM | POA: Diagnosis present

## 2016-07-20 DIAGNOSIS — I10 Essential (primary) hypertension: Secondary | ICD-10-CM | POA: Insufficient documentation

## 2016-07-20 DIAGNOSIS — S42302A Unspecified fracture of shaft of humerus, left arm, initial encounter for closed fracture: Secondary | ICD-10-CM

## 2016-07-20 DIAGNOSIS — Z7982 Long term (current) use of aspirin: Secondary | ICD-10-CM | POA: Insufficient documentation

## 2016-07-20 DIAGNOSIS — Z853 Personal history of malignant neoplasm of breast: Secondary | ICD-10-CM | POA: Insufficient documentation

## 2016-07-20 DIAGNOSIS — I6782 Cerebral ischemia: Secondary | ICD-10-CM | POA: Diagnosis not present

## 2016-07-20 DIAGNOSIS — Z79899 Other long term (current) drug therapy: Secondary | ICD-10-CM | POA: Insufficient documentation

## 2016-07-20 MED ORDER — HYDROCODONE-ACETAMINOPHEN 5-325 MG PO TABS
1.0000 | ORAL_TABLET | ORAL | Status: AC
  Administered 2016-07-20: 1 via ORAL
  Filled 2016-07-20: qty 1

## 2016-07-20 MED ORDER — HYDROCODONE-ACETAMINOPHEN 5-325 MG PO TABS: 1.0000 | ORAL_TABLET | Freq: Four times a day (QID) | ORAL | 0 refills | Status: AC | PRN

## 2016-07-20 NOTE — ED Notes (Signed)
This RN and Di Kindle, EDT assisted patient in getting dressed and applying a shoulder immobilizer as discussed with MD. This RN performed teaching with patient's son in how to apply the immobilizer if patient should have to take it off. Pt then assisted to the bathroom by this RN and Di Kindle, EDT. Pt tolerated well at this time.

## 2016-07-20 NOTE — ED Triage Notes (Signed)
Pt states she was reaching around the refrigerator door and lost her balance and fell injuring her left upper arm.. Also states she just had surgery on her back for a compression fx a week ago.Marland Kitchen

## 2016-07-20 NOTE — ED Notes (Signed)
NAD noted at time of D/C. Pt taken to the lobby via wheelchiar by Morey Hummingbird, EDT. Pt's son denies any comments/concerns at this time.

## 2016-07-20 NOTE — Discharge Instructions (Signed)
Take hydrocodone as prescribed for severe pain. Do not drink alcohol, drive or participate in any other potentially dangerous activities while taking this medication as it may make you sleepy. Do not take this medication with any other sedating medications, either prescription or over-the-counter. If you were prescribed Percocet or Vicodin, do not take these with acetaminophen (Tylenol) as it is already contained within these medications.   This medication is an opiate (or narcotic) pain medication and can be habit forming.  Use it as little as possible to achieve adequate pain control.  Do not use or use it with extreme caution if you have a history of opiate abuse or dependence.  If you are on a pain contract with your primary care doctor or a pain specialist, be sure to let them know you were prescribed this medication today from the [facility] Emergency Department.  This medication is intended for your use only - do not give any to anyone else and keep it in a secure place where nobody else, especially children, have access to it.  It will also cause or worsen constipation, so you may want to consider taking an over-the-counter stool softener while you are taking this medication.  If you have worsening pain or swelling, if the shoulder dislocates again, or if you have any other symptoms that concern you, please return immediately to the Emergency Department.

## 2016-07-20 NOTE — ED Provider Notes (Signed)
[facility] Emergency Department Provider Note   ____________________________________________   First MD Initiated Contact with Patient 07/20/16 1426     (approximate)  I have reviewed the triage vital signs and the nursing notes.   HISTORY  Chief Complaint Arm Pain    HPI Joyce Horton is a 75 y.o. female comes for evaluation of pain in the left shoulder and upper arm. Patient and her son were at home, the patient was opening the refrigerator and as she was opening a refrigerator door and she leaned to the left and lost her balance falling onto her left shoulder. She denied having pain there since, reports his significant pain when she tries to move her left arm and the pain is located at the left shoulder  She denies fall or hitting her head, patient's son reports he heard her hit the floor and she was awake and did not pass out. She is admitted in her normal state of health after having had surgery on her back recently. She has not been having any fevers chills or weakness. She denies acting normally, reports until a few days ago she was taking hydrocodone 10 mg as needed for pain after surgery  No pain or burning with urination. No nausea or vomiting. No recent illness. She did have recent surgery which is been healing well and not causing significant problem.  She reports pain in her normal state of health and son affirms this. Past Medical History:  Diagnosis Date  . Anemia   . 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 Neoadjuvant chemotherapy. Wide local excision, sentinel node biopsy completed May 2011 followed by whole breast radiation.  . Fall 2012  . GERD (gastroesophageal reflux disease)   . History of chemotherapy   . Hypertension   . Obesity   . Osteoporosis   . Ulcer [facility])     Patient Active Problem List   Diagnosis Date Noted  . A-fib ([place]) 12/24/2015  . Fat necrosis (segmental) of  breast 08/18/2014  . Cancer of central portion of left female breast ([place]) 08/19/2013    Past Surgical History:  Procedure Laterality Date  . BREAST BIOPSY  2000,2007,2010   left breast 2010  . BREAST SURGERY Left 2011   lumpectomy  . COLONOSCOPY  2002,2007,2010   [facility]  . KYPHOPLASTY N/A 07/10/2016   Procedure: KYPHOPLASTY  T 11;  Surgeon: Hessie Knows, MD;  Location: [facility] ORS;  Service: Orthopedics;  Laterality: N/A;  . SKIN LESION EXCISION  [id number]  . stents     R leg  . TIBIA FRACTURE SURGERY Right July 2013   right femur fracture with internal fixation.    Prior to Admission medications   Medication Sig Start Date End Date Taking? Authorizing Provider  acetaminophen (TYLENOL) 500 MG tablet Take 500 mg by mouth every 6 (six) hours as needed.    Historical Provider, MD  ALPRAZolam Duanne Moron) 0.25 MG tablet Take 0.25 mg by mouth at bedtime as needed for anxiety.    Historical Provider, MD  apixaban (ELIQUIS) 5 MG TABS tablet Take 1 tablet (5 mg total) by mouth 2 (two) times daily. 12/27/15   Hillary Bow, MD  aspirin 325 MG tablet Take 325 mg by mouth daily.    Historical Provider, MD  Calcium-Vitamin D (CALTRATE 600 PLUS-VIT D PO) Take 1 tablet by mouth daily.     Historical Provider, MD  cetirizine (ZYRTEC) 10 MG chewable tablet Chew 10 mg by  mouth as needed for allergies.    Historical Provider, MD  diltiazem (CARDIZEM CD) 180 MG 24 hr capsule Take 1 capsule (180 mg total) by mouth daily. 12/27/15   Srikar Sudini, MD  donepezil (ARICEPT) 10 MG tablet Take 10 mg by mouth at bedtime.    Historical Provider, MD  FLUoxetine (PROZAC) 20 MG tablet Take 20 mg by mouth daily.    Historical Provider, MD  HYDROcodone-acetaminophen (NORCO/VICODIN) 5-325 MG tablet Take 1 tablet by mouth every 6 (six) hours as needed for moderate pain. 07/20/16   Delman Kitten, MD  metoprolol (LOPRESSOR) 100 MG tablet Take 1 tablet (100 mg total) by mouth 2 (two) times daily. 12/27/15   Srikar Sudini, MD    Multiple Vitamins-Minerals (MULTIVITAMIN WITH MINERALS) tablet Take 1 tablet by mouth daily.    Historical Provider, MD  naproxen sodium (ANAPROX) 220 MG tablet Take 220 mg by mouth 2 (two) times daily as needed.    Historical Provider, MD  oxybutynin (DITROPAN) 5 MG tablet Take 5 mg by mouth 2 (two) times daily.    Historical Provider, MD  predniSONE (DELTASONE) 20 MG tablet Take 2 tablets (40 mg total) by mouth daily with breakfast. Patient not taking: Reported on 07/10/2016 12/27/15   Hillary Bow, MD    Allergies Fentanyl  Family History  Problem Relation Age of Onset  . Cancer Mother     breast  . CAD Father     Social History Social History  Substance Use Topics  . Smoking status: Never Smoker  . Smokeless tobacco: Never Used  . Alcohol use 0.0 oz/week    Review of Systems Constitutional: No fever/chills Eyes: No visual changes. ENT: No sore throat. Cardiovascular: Denies chest pain. Respiratory: Denies shortness of breath. Gastrointestinal: No abdominal pain.  No nausea, no vomiting.  No diarrhea.  No constipation. Genitourinary: Negative for dysuria. Musculoskeletal: Recent surgery, healing well and pain is slowly improving. Denies injury to the legs or right arm. No chest pain. No pain with inspiration. Denies neck pain Skin: Negative for rash. Neurological: Negative for headaches, focal weakness or numbness.  10-point ROS otherwise negative.  ____________________________________________   PHYSICAL EXAM:  VITAL SIGNS: ED Triage Vitals  Enc Vitals Group     BP 07/20/16 1338 (!) 115/92     Pulse Rate 07/20/16 1338 (!) 120     Resp 07/20/16 1338 18     Temp 07/20/16 1338 98 F (36.7 C)     Temp Source 07/20/16 1338 Oral     SpO2 07/20/16 1338 95 %     Weight 07/20/16 1338 184 lb (83.5 kg)     Height 07/20/16 1338 5' (1.524 m)     Head Circumference --      Peak Flow --      Pain Score 07/20/16 1346 9     Pain Loc --      Pain Edu? --      Excl. in  [place]? --     Constitutional: Alert and oriented. Well appearing and in no acute distressBut holding her left shoulder and reports just painful. Eyes: Conjunctivae are normal. PERRL. EOMI. Head: Atraumatic. Nose: No congestion/rhinnorhea. Mouth/Throat: Mucous membranes are moist.  Oropharynx non-erythematous. Neck: No stridor.  No midline cervical tenderness Cardiovascular: Heart rate 105, irregular rhythm. Grossly normal heart sounds.  Good peripheral circulation. Patient reports she has a known history of atrial fibrillation and is on blood thinner for Respiratory: Normal respiratory effort.  No retractions. Lungs CTAB. Gastrointestinal: Soft and nontender.  No distention. No abdominal bruits. No CVA tenderness. Musculoskeletal:   RIGHT Right upper extremity demonstrates normal strength, good use of all muscles. No edema bruising or contusions of the right shoulder/upper arm, right elbow, right forearm / hand. Full range of motion of the right right upper extremity without pain. No evidence of trauma. Strong radial pulse. Intact median/ulnar/radial neuro-muscular exam.  LEFT Left upper extremity demonstrates normal strength, good use of all muscles except for pain limiting movement at the left shoulder. No edema bruising or contusions of the left  left elbow, left forearm / hand. Full range of motion of the left  upper extremity without pain except the left upper arm with particular point tenderness over the left shoulder.  Strong radial pulse. Intact median/ulnar/radial neuro-muscular exam.  Lower Extremities  No edema. Normal DP/PT pulses bilateral with good cap refill.  Normal neuro-motor function lower extremities bilateral.  RIGHT Right lower extremity demonstrates normal strength, good use of all muscles. No edema bruising or contusions of the right hip, right knee, right ankle. Full range of motion of the right lower extremity without pain. No pain on axial loading. No evidence of  trauma.  LEFT Left lower extremity demonstrates normal strength, good use of all muscles. No edema bruising or contusions of the hip,  knee, ankle. Full range of motion of the left lower extremity without pain. No pain on axial loading. No evidence of trauma.  Midline back incision is clean dry intact Neurologic:  Normal speech and language. No gross focal neurologic deficits are appreciated. No gait instability. Skin:  Skin is warm, dry and intact. No rash noted. Psychiatric: Mood and affect are normal. Speech and behavior are normal.  ____________________________________________   LABS (all labs ordered are listed, but only abnormal results are displayed)  Labs Reviewed - No data to display ____________________________________________  EKG   ____________________________________________  RADIOLOGY  Ct Head Wo Contrast  Result Date: 07/20/2016 CLINICAL DATA:  Fall on Eliquis. Initial encounter. EXAM: CT HEAD WITHOUT CONTRAST TECHNIQUE: Contiguous axial images were obtained from the base of the skull through the vertex without intravenous contrast. COMPARISON:  04/24/2016 FINDINGS: Brain: No evidence of acute infarction, hemorrhage, hydrocephalus, extra-axial collection or mass lesion/mass effect. Moderate generalized atrophy and chronic microvascular ischemic change in the cerebral white matter. Vascular: Atherosclerotic calcification. Skull: Negative for fracture Sinuses/Orbits: Chronic right sphenoid sinusitis with near complete opacification. IMPRESSION: 1. No acute finding. 2. Moderate atrophy and chronic microvascular disease. 3. Chronic right sphenoid sinusitis. Electronically Signed   By: Monte Fantasia M.D.   On: 07/20/2016 15:27   Dg Humerus Left  Result Date: 07/20/2016 CLINICAL DATA:  Pt states she was reaching around the refrigerator door and lost her balance and fell injuring her left upper arm. Also states she just had surgery on her back for a compression fx a week  ago. EXAM: LEFT HUMERUS - 2+ VIEW COMPARISON:  None. FINDINGS: Markedly displaced fracture of the left humeral neck, with overriding of the main fracture fragments and probable impaction at the fracture site. Main component of the humeral head appears to remain well-positioned relative to the glenoid fossa. Distal humerus is intact and normally aligned. IMPRESSION: Displaced fracture of the left humeral neck, as detailed above. Electronically Signed   By: Franki Cabot M.D.   On: 07/20/2016 14:12    ____________________________________________   PROCEDURES  Procedure(s) performed: None  Procedures  Critical Care performed: No  ____________________________________________   INITIAL IMPRESSION / ASSESSMENT AND PLAN / ED COURSE  Pertinent labs & imaging results that were available during my care of the patient were reviewed by me and considered in my medical decision making (see chart for details).  Patient presents after falling while opening a refrigerator. She reports pain in her normal state of health, and she is not C she struck her head but she is noted to be on anticoagulant, the CT head obtained. She has focal pain and discomfort about the left shoulder  X-rays reveal left shoulder fracture. She denies any cardiac pulmonary or systemic symptoms. Known history of atrial fibrillation  I discussed discussed with the patient and her son that given her recent surgery we would offer testing to make sure she doesn't have a low blood count, her electrolytes are abnormal, that she may not of developed urinary tract infection but after discussing with the patient and her son tables reports she is in her normal health and that she does have poor balance, and today she simply lost her balance (refrigerator. They do not wish for any blood work at this time and I think this is reasonable via shared medical decision making.  Patient's pain is well-controlled after 2 hydrocodone tablets, she is  advanced age and reports that she is been able to take 1-2 hydrocodone tablets as prescriptions previous without side effect or oversedation. She does appear to have her pain well controlled after hydrocodone, and I'll provide her prescription. She has follow-up already with Dr. Youlanda Mighty on Thursday and careful return precautions are discussed as well as recommendation to follow-up with Dr. Doy Hutching in the next couple of days as well.  Clinical Course    Left arm placed in a sling, she did not tolerate shoulder immobilizer well due to pain. Sling appears effective, pain reduced, she is vascularly intact after placement ____________________________________________   FINAL CLINICAL IMPRESSION(S) / ED DIAGNOSES  Final diagnoses:  Closed left arm fracture, initial encounter      NEW MEDICATIONS STARTED DURING THIS VISIT:  Discharge Medication List as of 07/20/2016  6:05 PM       Note:  This document was prepared using Dragon voice recognition software and may include unintentional dictation errors.     Delman Kitten, MD 07/20/16 2225

## 2016-07-24 ENCOUNTER — Other Ambulatory Visit: Payer: Self-pay | Admitting: Surgery

## 2016-07-24 DIAGNOSIS — S42222A 2-part displaced fracture of surgical neck of left humerus, initial encounter for closed fracture: Secondary | ICD-10-CM

## 2016-07-25 ENCOUNTER — Ambulatory Visit
Admission: RE | Admit: 2016-07-25 | Discharge: 2016-07-25 | Disposition: A | Discharge status: Home / Self Care | Payer: Medicare Other | Source: Ambulatory Visit | Attending: Surgery | Admitting: Surgery

## 2016-07-25 DIAGNOSIS — I700 Atherosclerosis of aorta: Secondary | ICD-10-CM

## 2016-07-25 DIAGNOSIS — S42222A 2-part displaced fracture of surgical neck of left humerus, initial encounter for closed fracture: Secondary | ICD-10-CM

## 2016-07-25 DIAGNOSIS — X58XXXA Exposure to other specified factors, initial encounter: Secondary | ICD-10-CM

## 2016-07-25 DIAGNOSIS — A419 Sepsis, unspecified organism: Secondary | ICD-10-CM | POA: Diagnosis not present

## 2016-07-25 DIAGNOSIS — R4182 Altered mental status, unspecified: Secondary | ICD-10-CM | POA: Diagnosis not present

## 2016-07-27 ENCOUNTER — Emergency Department: Payer: Medicare Other

## 2016-07-27 ENCOUNTER — Encounter: Payer: Self-pay | Admitting: Emergency Medicine

## 2016-07-27 ENCOUNTER — Inpatient Hospital Stay
Admission: EM | Admit: 2016-07-27 | Discharge: 2016-08-06 | DRG: 871 | Disposition: E | Payer: Medicare Other | Attending: Pulmonary Disease | Admitting: Pulmonary Disease

## 2016-07-27 DIAGNOSIS — R188 Other ascites: Secondary | ICD-10-CM | POA: Diagnosis present

## 2016-07-27 DIAGNOSIS — R109 Unspecified abdominal pain: Secondary | ICD-10-CM

## 2016-07-27 DIAGNOSIS — E860 Dehydration: Secondary | ICD-10-CM | POA: Diagnosis present

## 2016-07-27 DIAGNOSIS — D631 Anemia in chronic kidney disease: Secondary | ICD-10-CM | POA: Diagnosis present

## 2016-07-27 DIAGNOSIS — R6521 Severe sepsis with septic shock: Secondary | ICD-10-CM | POA: Diagnosis not present

## 2016-07-27 DIAGNOSIS — E8770 Fluid overload, unspecified: Secondary | ICD-10-CM | POA: Diagnosis present

## 2016-07-27 DIAGNOSIS — Z885 Allergy status to narcotic agent status: Secondary | ICD-10-CM

## 2016-07-27 DIAGNOSIS — A419 Sepsis, unspecified organism: Secondary | ICD-10-CM | POA: Diagnosis not present

## 2016-07-27 DIAGNOSIS — I10 Essential (primary) hypertension: Secondary | ICD-10-CM | POA: Diagnosis present

## 2016-07-27 DIAGNOSIS — B9689 Other specified bacterial agents as the cause of diseases classified elsewhere: Secondary | ICD-10-CM | POA: Diagnosis present

## 2016-07-27 DIAGNOSIS — Z4659 Encounter for fitting and adjustment of other gastrointestinal appliance and device: Secondary | ICD-10-CM

## 2016-07-27 DIAGNOSIS — N170 Acute kidney failure with tubular necrosis: Secondary | ICD-10-CM | POA: Diagnosis present

## 2016-07-27 DIAGNOSIS — K7200 Acute and subacute hepatic failure without coma: Secondary | ICD-10-CM | POA: Diagnosis present

## 2016-07-27 DIAGNOSIS — R739 Hyperglycemia, unspecified: Secondary | ICD-10-CM | POA: Diagnosis not present

## 2016-07-27 DIAGNOSIS — Z9221 Personal history of antineoplastic chemotherapy: Secondary | ICD-10-CM

## 2016-07-27 DIAGNOSIS — R4182 Altered mental status, unspecified: Secondary | ICD-10-CM | POA: Diagnosis present

## 2016-07-27 DIAGNOSIS — D689 Coagulation defect, unspecified: Secondary | ICD-10-CM | POA: Diagnosis present

## 2016-07-27 DIAGNOSIS — N281 Cyst of kidney, acquired: Secondary | ICD-10-CM | POA: Diagnosis present

## 2016-07-27 DIAGNOSIS — Z79899 Other long term (current) drug therapy: Secondary | ICD-10-CM

## 2016-07-27 DIAGNOSIS — G9340 Encephalopathy, unspecified: Secondary | ICD-10-CM | POA: Diagnosis present

## 2016-07-27 DIAGNOSIS — Z8711 Personal history of peptic ulcer disease: Secondary | ICD-10-CM

## 2016-07-27 DIAGNOSIS — Z841 Family history of disorders of kidney and ureter: Secondary | ICD-10-CM

## 2016-07-27 DIAGNOSIS — E669 Obesity, unspecified: Secondary | ICD-10-CM | POA: Diagnosis present

## 2016-07-27 DIAGNOSIS — F329 Major depressive disorder, single episode, unspecified: Secondary | ICD-10-CM | POA: Diagnosis present

## 2016-07-27 DIAGNOSIS — K8010 Calculus of gallbladder with chronic cholecystitis without obstruction: Secondary | ICD-10-CM | POA: Diagnosis present

## 2016-07-27 DIAGNOSIS — I739 Peripheral vascular disease, unspecified: Secondary | ICD-10-CM | POA: Diagnosis present

## 2016-07-27 DIAGNOSIS — B9620 Unspecified Escherichia coli [E. coli] as the cause of diseases classified elsewhere: Secondary | ICD-10-CM | POA: Diagnosis present

## 2016-07-27 DIAGNOSIS — Z6832 Body mass index (BMI) 32.0-32.9, adult: Secondary | ICD-10-CM | POA: Diagnosis not present

## 2016-07-27 DIAGNOSIS — N390 Urinary tract infection, site not specified: Secondary | ICD-10-CM | POA: Diagnosis present

## 2016-07-27 DIAGNOSIS — I482 Chronic atrial fibrillation: Secondary | ICD-10-CM | POA: Diagnosis present

## 2016-07-27 DIAGNOSIS — J80 Acute respiratory distress syndrome: Secondary | ICD-10-CM | POA: Diagnosis present

## 2016-07-27 DIAGNOSIS — J9811 Atelectasis: Secondary | ICD-10-CM | POA: Diagnosis present

## 2016-07-27 DIAGNOSIS — H3530 Unspecified macular degeneration: Secondary | ICD-10-CM | POA: Diagnosis present

## 2016-07-27 DIAGNOSIS — Z66 Do not resuscitate: Secondary | ICD-10-CM | POA: Diagnosis not present

## 2016-07-27 DIAGNOSIS — J9690 Respiratory failure, unspecified, unspecified whether with hypoxia or hypercapnia: Secondary | ICD-10-CM

## 2016-07-27 DIAGNOSIS — Z853 Personal history of malignant neoplasm of breast: Secondary | ICD-10-CM

## 2016-07-27 DIAGNOSIS — E778 Other disorders of glycoprotein metabolism: Secondary | ICD-10-CM | POA: Diagnosis present

## 2016-07-27 DIAGNOSIS — E872 Acidosis: Secondary | ICD-10-CM | POA: Diagnosis not present

## 2016-07-27 DIAGNOSIS — K219 Gastro-esophageal reflux disease without esophagitis: Secondary | ICD-10-CM | POA: Diagnosis present

## 2016-07-27 DIAGNOSIS — Z8249 Family history of ischemic heart disease and other diseases of the circulatory system: Secondary | ICD-10-CM

## 2016-07-27 DIAGNOSIS — N179 Acute kidney failure, unspecified: Secondary | ICD-10-CM | POA: Diagnosis not present

## 2016-07-27 DIAGNOSIS — Z452 Encounter for adjustment and management of vascular access device: Secondary | ICD-10-CM

## 2016-07-27 DIAGNOSIS — F0280 Dementia in other diseases classified elsewhere without behavioral disturbance: Secondary | ICD-10-CM | POA: Diagnosis present

## 2016-07-27 DIAGNOSIS — Z803 Family history of malignant neoplasm of breast: Secondary | ICD-10-CM

## 2016-07-27 DIAGNOSIS — Z515 Encounter for palliative care: Secondary | ICD-10-CM | POA: Diagnosis present

## 2016-07-27 DIAGNOSIS — M810 Age-related osteoporosis without current pathological fracture: Secondary | ICD-10-CM | POA: Diagnosis present

## 2016-07-27 DIAGNOSIS — Z978 Presence of other specified devices: Secondary | ICD-10-CM

## 2016-07-27 DIAGNOSIS — F419 Anxiety disorder, unspecified: Secondary | ICD-10-CM | POA: Diagnosis present

## 2016-07-27 DIAGNOSIS — R932 Abnormal findings on diagnostic imaging of liver and biliary tract: Secondary | ICD-10-CM | POA: Diagnosis not present

## 2016-07-27 DIAGNOSIS — R1011 Right upper quadrant pain: Secondary | ICD-10-CM

## 2016-07-27 DIAGNOSIS — R6520 Severe sepsis without septic shock: Secondary | ICD-10-CM | POA: Diagnosis not present

## 2016-07-27 DIAGNOSIS — G309 Alzheimer's disease, unspecified: Secondary | ICD-10-CM | POA: Diagnosis present

## 2016-07-27 DIAGNOSIS — E875 Hyperkalemia: Secondary | ICD-10-CM | POA: Diagnosis present

## 2016-07-27 DIAGNOSIS — R32 Unspecified urinary incontinence: Secondary | ICD-10-CM | POA: Diagnosis present

## 2016-07-27 DIAGNOSIS — I4891 Unspecified atrial fibrillation: Secondary | ICD-10-CM | POA: Diagnosis not present

## 2016-07-27 DIAGNOSIS — Z7901 Long term (current) use of anticoagulants: Secondary | ICD-10-CM

## 2016-07-27 HISTORY — DX: Unspecified atrial fibrillation: I48.91

## 2016-07-27 LAB — CBC WITH DIFFERENTIAL/PLATELET
BASOS ABS: 0 10*3/uL (ref 0–0.1)
BASOS PCT: 0 %
Eosinophils Absolute: 0 10*3/uL (ref 0–0.7)
Eosinophils Relative: 0 %
HEMATOCRIT: 22.3 % — AB (ref 35.0–47.0)
HEMOGLOBIN: 7 g/dL — AB (ref 12.0–16.0)
LYMPHS PCT: 6 %
Lymphs Abs: 1.1 10*3/uL (ref 1.0–3.6)
MCH: 23.7 pg — ABNORMAL LOW (ref 26.0–34.0)
MCHC: 31.5 g/dL — ABNORMAL LOW (ref 32.0–36.0)
MCV: 75.4 fL — ABNORMAL LOW (ref 80.0–100.0)
Monocytes Absolute: 0.9 10*3/uL (ref 0.2–0.9)
Monocytes Relative: 5 %
NEUTROS ABS: 14.9 10*3/uL — AB (ref 1.4–6.5)
NEUTROS PCT: 89 %
Platelets: 622 10*3/uL — ABNORMAL HIGH (ref 150–440)
RBC: 2.95 MIL/uL — AB (ref 3.80–5.20)
RDW: 25.3 % — ABNORMAL HIGH (ref 11.5–14.5)
WBC: 16.9 10*3/uL — ABNORMAL HIGH (ref 3.6–11.0)

## 2016-07-27 LAB — URINALYSIS COMPLETE WITH MICROSCOPIC (ARMC ONLY)
Bilirubin Urine: NEGATIVE
Glucose, UA: NEGATIVE mg/dL
HGB URINE DIPSTICK: NEGATIVE
KETONES UR: NEGATIVE mg/dL
LEUKOCYTES UA: NEGATIVE
NITRITE: NEGATIVE
PROTEIN: NEGATIVE mg/dL
SPECIFIC GRAVITY, URINE: 1.016 (ref 1.005–1.030)
pH: 5 (ref 5.0–8.0)

## 2016-07-27 LAB — COMPREHENSIVE METABOLIC PANEL
ALBUMIN: 2.4 g/dL — AB (ref 3.5–5.0)
ALT: 14 U/L (ref 14–54)
ANION GAP: 13 (ref 5–15)
AST: 38 U/L (ref 15–41)
Alkaline Phosphatase: 107 U/L (ref 38–126)
BUN: 67 mg/dL — ABNORMAL HIGH (ref 6–20)
CHLORIDE: 99 mmol/L — AB (ref 101–111)
CO2: 21 mmol/L — AB (ref 22–32)
Calcium: 9.3 mg/dL (ref 8.9–10.3)
Creatinine, Ser: 2.63 mg/dL — ABNORMAL HIGH (ref 0.44–1.00)
GFR calc non Af Amer: 16 mL/min — ABNORMAL LOW (ref >60)
GFR, EST AFRICAN AMERICAN: 19 mL/min — AB (ref >60)
GLUCOSE: 143 mg/dL — AB (ref 65–99)
Potassium: 5.3 mmol/L — ABNORMAL HIGH (ref 3.5–5.1)
SODIUM: 133 mmol/L — AB (ref 135–145)
Total Bilirubin: 2.4 mg/dL — ABNORMAL HIGH (ref 0.3–1.2)
Total Protein: 5.7 g/dL — ABNORMAL LOW (ref 6.5–8.1)

## 2016-07-27 LAB — LACTIC ACID, PLASMA
LACTIC ACID, VENOUS: 4.1 mmol/L — AB (ref 0.5–1.9)
LACTIC ACID, VENOUS: 2 mmol/L — AB (ref 0.5–1.9)

## 2016-07-27 LAB — PREPARE RBC (CROSSMATCH)

## 2016-07-27 LAB — TROPONIN I: Troponin I: <0.03 ng/mL (ref <0.03)

## 2016-07-27 LAB — GLUCOSE, CAPILLARY: GLUCOSE-CAPILLARY: 129 mg/dL — AB (ref 65–99)

## 2016-07-27 LAB — ABO/RH: ABO/RH(D): O NEG

## 2016-07-27 LAB — MRSA PCR SCREENING: MRSA by PCR: NEGATIVE

## 2016-07-27 MED ORDER — DONEPEZIL HCL 5 MG PO TABS: 10.0000 mg | ORAL_TABLET | Freq: Every day | ORAL | Status: DC

## 2016-07-27 MED ORDER — VANCOMYCIN HCL IN DEXTROSE 750-5 MG/150ML-% IV SOLN
750.0000 mg | INTRAVENOUS | Status: DC
  Administered 2016-07-28: 750 mg via INTRAVENOUS
  Filled 2016-07-27: qty 150

## 2016-07-27 MED ORDER — METOPROLOL TARTRATE 5 MG/5ML IV SOLN
5.0000 mg | Freq: Four times a day (QID) | INTRAVENOUS | Status: DC
  Administered 2016-07-28 (×2): 5 mg via INTRAVENOUS
  Filled 2016-07-27 (×2): qty 5

## 2016-07-27 MED ORDER — SODIUM CHLORIDE 0.9% FLUSH
3.0000 mL | Freq: Two times a day (BID) | INTRAVENOUS | Status: DC
  Administered 2016-07-27 – 2016-07-29 (×5): 3 mL via INTRAVENOUS

## 2016-07-27 MED ORDER — SODIUM CHLORIDE 0.9 % IV SOLN
Freq: Once | INTRAVENOUS | Status: AC
  Administered 2016-07-27: 16:00:00 via INTRAVENOUS

## 2016-07-27 MED ORDER — DILTIAZEM HCL ER COATED BEADS 180 MG PO CP24
180.0000 mg | ORAL_CAPSULE | Freq: Every day | ORAL | Status: DC
  Administered 2016-07-28: 180 mg via ORAL
  Filled 2016-07-27: qty 1

## 2016-07-27 MED ORDER — MORPHINE SULFATE (PF) 2 MG/ML IV SOLN
2.0000 mg | INTRAVENOUS | Status: DC | PRN
  Administered 2016-07-27: 1 mg via INTRAVENOUS
  Administered 2016-07-28 (×2): 2 mg via INTRAVENOUS
  Administered 2016-07-28: 1 mg via INTRAVENOUS
  Administered 2016-07-28: 2 mg via INTRAVENOUS
  Filled 2016-07-27 (×6): qty 1

## 2016-07-27 MED ORDER — ACETAMINOPHEN 325 MG PO TABS: 650.0000 mg | ORAL_TABLET | Freq: Four times a day (QID) | ORAL | Status: DC | PRN

## 2016-07-27 MED ORDER — MORPHINE SULFATE (PF) 2 MG/ML IV SOLN
2.0000 mg | Freq: Once | INTRAVENOUS | Status: DC
  Filled 2016-07-27: qty 1

## 2016-07-27 MED ORDER — SODIUM CHLORIDE 0.9 % IV BOLUS (SEPSIS)
1000.0000 mL | Freq: Once | INTRAVENOUS | Status: AC
  Administered 2016-07-27: 1000 mL via INTRAVENOUS

## 2016-07-27 MED ORDER — MORPHINE SULFATE (PF) 2 MG/ML IV SOLN
1.0000 mg | Freq: Once | INTRAVENOUS | Status: AC
  Administered 2016-07-27: 1 mg via INTRAVENOUS
  Filled 2016-07-27: qty 1

## 2016-07-27 MED ORDER — PIPERACILLIN-TAZOBACTAM 3.375 G IVPB
3.3750 g | Freq: Two times a day (BID) | INTRAVENOUS | Status: DC
  Administered 2016-07-27: 3.375 g via INTRAVENOUS
  Filled 2016-07-27: qty 50

## 2016-07-27 MED ORDER — ONDANSETRON HCL 4 MG/2ML IJ SOLN: 4.0000 mg | Freq: Four times a day (QID) | INTRAMUSCULAR | Status: DC | PRN

## 2016-07-27 MED ORDER — SODIUM CHLORIDE 0.9 % IV SOLN
Freq: Once | INTRAVENOUS | Status: AC
  Administered 2016-07-27: 20:00:00 via INTRAVENOUS

## 2016-07-27 MED ORDER — METOPROLOL TARTRATE 50 MG PO TABS: 100.0000 mg | ORAL_TABLET | Freq: Two times a day (BID) | ORAL | Status: DC

## 2016-07-27 MED ORDER — VANCOMYCIN HCL IN DEXTROSE 1-5 GM/200ML-% IV SOLN
1000.0000 mg | Freq: Once | INTRAVENOUS | Status: AC
  Administered 2016-07-27: 1000 mg via INTRAVENOUS
  Filled 2016-07-27: qty 200

## 2016-07-27 MED ORDER — FLUOXETINE HCL 20 MG PO CAPS
20.0000 mg | ORAL_CAPSULE | Freq: Every day | ORAL | Status: DC
  Filled 2016-07-27 (×2): qty 1

## 2016-07-27 MED ORDER — PERMETHRIN 1 % EX LOTN
TOPICAL_LOTION | Freq: Once | CUTANEOUS | Status: DC
  Filled 2016-07-27: qty 59

## 2016-07-27 MED ORDER — LORATADINE 10 MG PO TABS
10.0000 mg | ORAL_TABLET | Freq: Every day | ORAL | Status: DC
  Administered 2016-07-28: 10 mg via ORAL
  Filled 2016-07-27: qty 1

## 2016-07-27 MED ORDER — ONDANSETRON HCL 4 MG PO TABS: 4.0000 mg | ORAL_TABLET | Freq: Four times a day (QID) | ORAL | Status: DC | PRN

## 2016-07-27 MED ORDER — ACETAMINOPHEN 650 MG RE SUPP
650.0000 mg | Freq: Four times a day (QID) | RECTAL | Status: DC | PRN
  Administered 2016-07-28 (×2): 650 mg via RECTAL
  Filled 2016-07-27 (×2): qty 1

## 2016-07-27 MED ORDER — OXYBUTYNIN CHLORIDE 5 MG PO TABS
5.0000 mg | ORAL_TABLET | Freq: Two times a day (BID) | ORAL | Status: DC
  Administered 2016-07-28: 5 mg via ORAL
  Filled 2016-07-27: qty 1

## 2016-07-27 MED ORDER — PIPERACILLIN-TAZOBACTAM 3.375 G IVPB 30 MIN
3.3750 g | Freq: Once | INTRAVENOUS | Status: AC
  Administered 2016-07-27: 3.375 g via INTRAVENOUS
  Filled 2016-07-27: qty 50

## 2016-07-27 MED ORDER — ADULT MULTIVITAMIN W/MINERALS CH: 1.0000 | ORAL_TABLET | Freq: Every day | ORAL | Status: DC

## 2016-07-27 MED ORDER — SODIUM CHLORIDE 0.9 % IV BOLUS (SEPSIS)
250.0000 mL | Freq: Once | INTRAVENOUS | Status: AC
  Administered 2016-07-27: 250 mL via INTRAVENOUS

## 2016-07-27 MED ORDER — APIXABAN 5 MG PO TABS
5.0000 mg | ORAL_TABLET | Freq: Two times a day (BID) | ORAL | Status: DC
  Administered 2016-07-28: 5 mg via ORAL
  Filled 2016-07-27: qty 1

## 2016-07-27 MED ORDER — SODIUM CHLORIDE 0.9 % IV SOLN
INTRAVENOUS | Status: DC
  Administered 2016-07-27 – 2016-07-28 (×3): via INTRAVENOUS

## 2016-07-27 NOTE — ED Notes (Signed)
Bladder scan performed 182 ml assessed

## 2016-07-27 NOTE — ED Provider Notes (Signed)
[facility] Emergency Department Provider Note ____________________________________________   I have reviewed the triage vital signs and the triage nursing note.  HISTORY  Chief Complaint Altered Mental Status   Historian L5 caveat, patient critical illness and poor historian Son provides the history  HPI Joyce Horton is a 75 y.o. female was brought in by her son who she is living with right now due to moaning and not making urine for 2 days. He states that she had been wetting her diaper, but the last 2 days he really has not noticed a wet diaper before her being able to urinate when he gets up to the potty. She fell and broke her left humerus last week, and followed up with orthopedic physician who recommended she is probably going to need surgery. She had been taking narcotic medicine for pain, but moaning nonetheless. Son did not know that she been having a fever. She has had less by mouth intake. This morning she was less alert or arousable and reportedly EMS gave her Narcan and she did become more responsive.  Son notes that the ecchymosis to the left shoulder and entire arm appears worse than it did initially.  Has not noticed a cough, vomiting, or diarrhea.    Past Medical History:  Diagnosis Date  . Anemia   . 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 Neoadjuvant chemotherapy. Wide local excision, sentinel node biopsy completed May 2011 followed by whole breast radiation.  . Fall 2012  . GERD (gastroesophageal reflux disease)   . History of chemotherapy   . Hypertension   . Obesity   . Osteoporosis   . Ulcer [facility])     Patient Active Problem List   Diagnosis Date Noted  . A-fib ([place]) 12/24/2015  . Fat necrosis (segmental) of breast 08/18/2014  . Cancer of central portion of left female breast ([place]) 08/19/2013    Past Surgical History:  Procedure Laterality Date  . BREAST BIOPSY  2000,2007,2010    left breast 2010  . BREAST SURGERY Left 2011   lumpectomy  . COLONOSCOPY  2002,2007,2010   [facility]  . KYPHOPLASTY N/A 07/10/2016   Procedure: KYPHOPLASTY  T 11;  Surgeon: Hessie Knows, MD;  Location: [facility] ORS;  Service: Orthopedics;  Laterality: N/A;  . SKIN LESION EXCISION  [id number]  . stents     R leg  . TIBIA FRACTURE SURGERY Right July 2013   right femur fracture with internal fixation.    Prior to Admission medications   Medication Sig Start Date End Date Taking? Authorizing Provider  acetaminophen (TYLENOL) 500 MG tablet Take 500 mg by mouth every 6 (six) hours as needed for moderate pain or fever.    Yes Historical Provider, MD  ALPRAZolam Duanne Moron) 0.25 MG tablet Take 0.25 mg by mouth at bedtime as needed for anxiety.   Yes Historical Provider, MD  apixaban (ELIQUIS) 5 MG TABS tablet Take 1 tablet (5 mg total) by mouth 2 (two) times daily. 12/27/15  Yes Srikar Sudini, MD  Calcium-Vitamin D (CALTRATE 600 PLUS-VIT D PO) Take 1 tablet by mouth daily.    Yes Historical Provider, MD  cetirizine (ZYRTEC) 10 MG tablet Take 10 mg by mouth daily as needed for allergies.   Yes Historical Provider, MD  diltiazem (CARDIZEM CD) 180 MG 24 hr capsule Take 1 capsule (180 mg total) by mouth daily. 12/27/15  Yes Srikar Sudini, MD  donepezil (ARICEPT) 10 MG tablet Take 10 mg by mouth  at bedtime.   Yes Historical Provider, MD  FLUoxetine (PROZAC) 20 MG tablet Take 20 mg by mouth daily.   Yes Historical Provider, MD  furosemide (LASIX) 40 MG tablet Take 40 mg by mouth daily.   Yes Historical Provider, MD  HYDROcodone-acetaminophen (NORCO/VICODIN) 5-325 MG tablet Take 1 tablet by mouth every 6 (six) hours as needed for moderate pain. 07/20/16  Yes Delman Kitten, MD  metoprolol (LOPRESSOR) 100 MG tablet Take 1 tablet (100 mg total) by mouth 2 (two) times daily. 12/27/15  Yes Srikar Sudini, MD  Multiple Vitamins-Minerals (MULTIVITAMIN WITH MINERALS) tablet Take 1 tablet by mouth daily.   Yes Historical  Provider, MD  naproxen sodium (ANAPROX) 220 MG tablet Take 220 mg by mouth 2 (two) times daily as needed. For pain   Yes Historical Provider, MD  oxybutynin (DITROPAN) 5 MG tablet Take 5 mg by mouth 2 (two) times daily.   Yes Historical Provider, MD  potassium chloride SA (K-DUR,KLOR-CON) 20 MEQ tablet Take 20 mEq by mouth daily.   Yes Historical Provider, MD    Allergies  Allergen Reactions  . Fentanyl Other (See Comments)    Hallucinations and mental alterations    Family History  Problem Relation Age of Onset  . Cancer Mother     breast  . CAD Father     Social History Social History  Substance Use Topics  . Smoking status: Never Smoker  . Smokeless tobacco: Never Used  . Alcohol use 0.0 oz/week    Review of Systems  Unable to obtain due to patient's altered mental status and critical illness Per the son, no fever, report of chest pain or coughing, vomiting or diarrhea. ____________________________________________   PHYSICAL EXAM:  VITAL SIGNS: ED Triage Vitals  Enc Vitals Group     BP --      Pulse Rate 07/22/2016 1344 (!) 118     Resp 07/21/2016 1344 (!) 26     Temp 07/06/2016 1344 99.1 F (37.3 C)     Temp Source 08/01/2016 1344 Axillary     SpO2 08/03/2016 1344 98 %     Weight 07/21/2016 1345 166 lb 8 oz (75.5 kg)     Height 07/13/2016 1345 5' (1.524 m)     Head Circumference --      Peak Flow --      Pain Score --      Pain Loc --      Pain Edu? --      Excl. in [place]? --      Constitutional: Alert And answers some questions verbally, but doesn't really open her eyes even when I asked her to. Moaning but one I ask her if she is in pain she states "I have a baby " HEENT   Head: Normocephalic and atraumatic.      Eyes: Conjunctivae are normal. PERRL. Normal extraocular movements.      Ears:         Nose: No congestion/rhinnorhea.   Mouth/Throat: Mucous membranes are moist.   Neck: No stridor. Cardiovascular/Chest: Normal rate, regular rhythm.  No  murmurs, rubs, or gallops. Respiratory: Normal respiratory effort without tachypnea nor retractions. Decreased breath sounds throughout, mild rhonchi without wheezing. Gastrointestinal: Soft. No distention, no guarding, no rebound. Mild tenderness diffusely. Genitourinary/rectal:Deferred Musculoskeletal: Left arm in a sling, moderate swelling and ecchymosis from the shoulder down to the hand. Neurologic: No slurred speech. No facial droop. He follows most commands verbally, moves 4 extremities although left upper extremity is somewhat limited due  to her fracture. No clear sensory deficits. Skin:  Skin is warm, dry and intact. No rash noted. Psychiatric: Mildly agitated.   ____________________________________________  LABS (pertinent positives/negatives)  Labs Reviewed  COMPREHENSIVE METABOLIC PANEL - Abnormal; Notable for the following:       Result Value   Sodium 133 (*)    Potassium 5.3 (*)    Chloride 99 (*)    CO2 21 (*)    Glucose, Bld 143 (*)    BUN 67 (*)    Creatinine, Ser 2.63 (*)    Total Protein 5.7 (*)    Albumin 2.4 (*)    Total Bilirubin 2.4 (*)    GFR calc non Af Amer 16 (*)    GFR calc Af Amer 19 (*)    All other components within normal limits  LACTIC ACID, PLASMA - Abnormal; Notable for the following:    Lactic Acid, Venous 4.1 (*)    All other components within normal limits  CBC WITH DIFFERENTIAL/PLATELET - Abnormal; Notable for the following:    WBC 16.9 (*)    RBC 2.95 (*)    Hemoglobin 7.0 (*)    HCT 22.3 (*)    MCV 75.4 (*)    MCH 23.7 (*)    MCHC 31.5 (*)    RDW 25.3 (*)    Platelets 622 (*)    Neutro Abs 14.9 (*)    All other components within normal limits  URINALYSIS COMPLETEWITH MICROSCOPIC ([facility] ONLY) - Abnormal; Notable for the following:    Color, Urine AMBER (*)    APPearance CLEAR (*)    Bacteria, UA RARE (*)    Squamous Epithelial / LPF 0-5 (*)    All other components within normal limits  URINE CULTURE  CULTURE, BLOOD (ROUTINE X  2)  CULTURE, BLOOD (ROUTINE X 2)  TROPONIN I  LACTIC ACID, PLASMA  TYPE AND SCREEN  PREPARE RBC (CROSSMATCH)    ____________________________________________    EKG I, Lisa Roca, MD, the attending physician have personally viewed and interpreted all ECGs.  120 7/8 per minute irregularly irregular consistent with atrial fibrillation with rapid ventricular response. Normal axis. Nonspecific ST and T-wave ____________________________________________  RADIOLOGY All Xrays were viewed by me.  Chest x-ray portable: Reviewed by me -- no clear infiltrate or pneumothorax. Radiologist read is pending __________________________________________  PROCEDURES  Procedure(s) performed: None  Critical Care performed: CRITICAL CARE Performed by: Lisa Roca   Total critical care time: 45 minutes  Critical care time was exclusive of separately billable procedures and treating other patients.  Critical care was necessary to treat or prevent imminent or life-threatening deterioration.  Critical care was time spent personally by me on the following activities: development of treatment plan with patient and/or surrogate as well as nursing, discussions with consultants, evaluation of patient's response to treatment, examination of patient, obtaining history from patient or surrogate, ordering and performing treatments and interventions, ordering and review of laboratory studies, ordering and review of radiographic studies, pulse oximetry and re-evaluation of patient's condition.   ____________________________________________   ED COURSE / ASSESSMENT AND PLAN  Pertinent labs & imaging results that were available during my care of the patient were reviewed by me and considered in my medical decision making (see chart for details).   Ms. Sliwinski was brought in by her son for what sounds like moaning followed by decreased level of consciousness for which she was given Narcan and had some  improvement in mental status, but he also brought her in because  she had decreased urine output.  But that ultrasound showed no sign of urinary retention. Patient was febrile here and hypoxic, and was started on sepsis protocol. Chest x-ray by my view it is not obvious pneumonia, but this point she is on nonrebreather with mid 90s oxygenation and slight improvement in her agitation.  Laboratory studies indicate a low white blood cell count. She was given vancomycin and Zosyn. Her lactate came back elevated and she will be given 30 cc/kg IV fluid resuscitation.     CONSULTATIONS:  Hospitalist for admission.   Patient / Family / Caregiver informed of clinical course, medical decision-making process, and agree with plan.  ___________________________________________   FINAL CLINICAL IMPRESSION(S) / ED DIAGNOSES   Final diagnoses:  Sepsis, due to unspecified organism [facility])              Note: This dictation was prepared with Dragon dictation. Any transcriptional errors that result from this process are unintentional    Lisa Roca, MD 07/17/2016 1601

## 2016-07-27 NOTE — Progress Notes (Signed)
ANTIBIOTIC CONSULT NOTE - INITIAL  Pharmacy Consult for Vancomycin, Zosyn  Indication: sepsis  Allergies  Allergen Reactions  . Fentanyl Other (See Comments)    Hallucinations and mental alterations    Patient Measurements: Height: 5' (152.4 cm) Weight: 166 lb 8 oz (75.5 kg) IBW/kg (Calculated) : 45.5 Adjusted Body Weight: 57.5 kg   Vital Signs: Temp: 98.4 F (36.9 C) (10/22 2006) Temp Source: Axillary (10/22 2006) BP: 109/62 (10/22 2007) Pulse Rate: 137 (10/22 2007) Intake/Output from previous day: No intake/output data recorded. Intake/Output from this shift: No intake/output data recorded.  Labs:  Recent Labs  07/09/2016 1406  WBC 16.9*  HGB 7.0*  PLT 622*  CREATININE 2.63*   Estimated Creatinine Clearance: 16.3 mL/min (by C-G formula based on SCr of 2.63 mg/dL (H)). No results for input(s): VANCOTROUGH, VANCOPEAK, VANCORANDOM, GENTTROUGH, GENTPEAK, GENTRANDOM, TOBRATROUGH, TOBRAPEAK, TOBRARND, AMIKACINPEAK, AMIKACINTROU, AMIKACIN in the last 72 hours.   Microbiology: No results found for this or any previous visit (from the past 720 hour(s)).  Medical History: Past Medical History:  Diagnosis Date  . Anemia   . 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 Neoadjuvant chemotherapy. Wide local excision, sentinel node biopsy completed May 2011 followed by whole breast radiation.  . Fall 2012  . GERD (gastroesophageal reflux disease)   . History of chemotherapy   . Hypertension   . Obesity   . Osteoporosis   . Ulcer ([place])     Medications:  Scheduled:  . sodium chloride   Intravenous Once  . apixaban  5 mg Oral BID  . diltiazem  180 mg Oral Daily  . donepezil  10 mg Oral QHS  . FLUoxetine  20 mg Oral Daily  . loratadine  10 mg Oral Daily  . metoprolol  100 mg Oral BID  . multivitamin with minerals  1 tablet Oral Daily  . oxybutynin  5 mg Oral BID  . piperacillin-tazobactam (ZOSYN)  IV  3.375 g Intravenous Q12H   . sodium chloride flush  3 mL Intravenous Q12H  . [START ON 07/28/2016] vancomycin  750 mg Intravenous Q36H   Assessment: CrCl = 16.3 ml/min  Ke = 0.018 hr-1 T1/2 = 38.5 hrs Vd = 40.3 L   Goal of Therapy:  Vancomycin trough level 15-20 mcg/ml  Plan:  Expected duration 7 days with resolution of temperature and/or normalization of WBC   Zosyn 3.375 gm IV Q12H ordered to start on 10/23 @ 0400.   Vancomycin 1 gm IV X 1 given on 10/22 @ 16:00. Vancomycin 750 mg IV Q36H ordered to start on 10/23 @ 17:00, 25 hrs after 1st dose (stacked dosing). Pt is in AKI secondary to dehydration.  Renal function will likely improve with rehydration.   No trough ordered yet. Will need to order trough once renal function improves.   Whitney Bingaman D 08/04/2016,8:12 PM

## 2016-07-27 NOTE — ED Triage Notes (Signed)
Ems from home , unresponsive, cool /clammy , family at scene, pt received no meds today , EMS administered 1 mg narcan pt became responsive, AMS , moaning/ groaning " help, help " , , pt with DX of left shoulder fx x1 week ago.

## 2016-07-27 NOTE — ED Notes (Signed)
MD Lord to bedside. Pt continues to call out.

## 2016-07-27 NOTE — H&P (Signed)
[place] [facility] [place] NAME: Joyce Horton    MR#:  [id number]  DATE OF BIRTH:  [birth year]  DATE OF ADMISSION:    PRIMARY CARE PHYSICIAN: Idelle Crouch, MD   REQUESTING/REFERRING PHYSICIAN: Dr. Lisa Roca  CHIEF COMPLAINT:   Chief Complaint  Patient presents with  . Altered Mental Status    HISTORY OF PRESENT ILLNESS:  Joyce Horton  is a 75 y.o. female with a known history of Dementia, osteoporosis, hypertension, history of breast cancer, GERD, recent fall with left humeral fracture resents to the hospital due to altered mental status, poor by mouth intake, not being able to urinate over the past few days. Patient presented to the emergency room was noted to have a low-grade fever 100.5, she was also noted to be tachycardic and noted to have an elevated lactate. Patient was also initially hypoxic with O2 sats in the mid 60s. Patient was also noted to be in acute kidney injury with hyperkalemia.  Hospitalist services were contacted further treatment and evaluation. Patient herself is a very poor historian given her dementia and most of the history obtained from the son at bedside. As per the son patient has had poor by mouth intake over the past couple days and also has been more confused than usual.  PAST MEDICAL HISTORY:   Past Medical History:  Diagnosis Date  . Anemia   . 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 Neoadjuvant chemotherapy. Wide local excision, sentinel node biopsy completed May 2011 followed by whole breast radiation.  . Fall 2012  . GERD (gastroesophageal reflux disease)   . History of chemotherapy   . Hypertension   . Obesity   . Osteoporosis   . Ulcer ([place])     PAST SURGICAL HISTORY:   Past Surgical History:  Procedure Laterality Date  . BREAST BIOPSY  2000,2007,2010   left breast 2010  . BREAST SURGERY Left 2011   lumpectomy  . COLONOSCOPY  2002,2007,2010    [place]  . KYPHOPLASTY N/A 07/10/2016   Procedure: KYPHOPLASTY  T 11;  Surgeon: Hessie Knows, MD;  Location: [facility] ORS;  Service: Orthopedics;  Laterality: N/A;  . SKIN LESION EXCISION  [id number]  . stents     R leg  . TIBIA FRACTURE SURGERY Right July 2013   right femur fracture with internal fixation.    SOCIAL HISTORY:   Social History  Substance Use Topics  . Smoking status: Never Smoker  . Smokeless tobacco: Never Used  . Alcohol use 0.0 oz/week    FAMILY HISTORY:   Family History  Problem Relation Age of Onset  . Cancer Mother     breast  . Kidney failure Mother   . CAD Father   . Heart failure Father     DRUG ALLERGIES:   Allergies  Allergen Reactions  . Fentanyl Other (See Comments)    Hallucinations and mental alterations    REVIEW OF SYSTEMS:   Review of Systems  Unable to perform ROS: Mental acuity    MEDICATIONS AT HOME:   Prior to Admission medications   Medication Sig Start Date End Date Taking? Authorizing Provider  acetaminophen (TYLENOL) 500 MG tablet Take 500 mg by mouth every 6 (six) hours as needed for moderate pain or fever.    Yes Historical Provider, MD  ALPRAZolam Duanne Moron) 0.25 MG tablet Take 0.25 mg by mouth at bedtime as needed for anxiety.   Yes Historical Provider, MD  apixaban (ELIQUIS) 5 MG TABS tablet Take 1 tablet (5 mg total) by mouth 2 (two) times daily. 12/27/15  Yes Srikar Sudini, MD  Calcium-Vitamin D (CALTRATE 600 PLUS-VIT D PO) Take 1 tablet by mouth daily.    Yes Historical Provider, MD  cetirizine (ZYRTEC) 10 MG tablet Take 10 mg by mouth daily as needed for allergies.   Yes Historical Provider, MD  diltiazem (CARDIZEM CD) 180 MG 24 hr capsule Take 1 capsule (180 mg total) by mouth daily. 12/27/15  Yes Srikar Sudini, MD  donepezil (ARICEPT) 10 MG tablet Take 10 mg by mouth at bedtime.   Yes Historical Provider, MD  FLUoxetine (PROZAC) 20 MG tablet Take 20 mg by mouth daily.   Yes Historical Provider, MD  furosemide (LASIX)  40 MG tablet Take 40 mg by mouth daily.   Yes Historical Provider, MD  HYDROcodone-acetaminophen (NORCO/VICODIN) 5-325 MG tablet Take 1 tablet by mouth every 6 (six) hours as needed for moderate pain. 07/20/16  Yes Delman Kitten, MD  metoprolol (LOPRESSOR) 100 MG tablet Take 1 tablet (100 mg total) by mouth 2 (two) times daily. 12/27/15  Yes Srikar Sudini, MD  Multiple Vitamins-Minerals (MULTIVITAMIN WITH MINERALS) tablet Take 1 tablet by mouth daily.   Yes Historical Provider, MD  naproxen sodium (ANAPROX) 220 MG tablet Take 220 mg by mouth 2 (two) times daily as needed. For pain   Yes Historical Provider, MD  oxybutynin (DITROPAN) 5 MG tablet Take 5 mg by mouth 2 (two) times daily.   Yes Historical Provider, MD  potassium chloride SA (K-DUR,KLOR-CON) 20 MEQ tablet Take 20 mEq by mouth daily.   Yes Historical Provider, MD      VITAL SIGNS:  Pulse (!) 118, temperature (!) 100.5 F (38.1 C), temperature source Rectal, resp. rate (!) 26, height 5' (1.524 m), weight 75.5 kg (166 lb 8 oz), SpO2 (!) 64 %.  PHYSICAL EXAMINATION:  Physical Exam  GENERAL:  75 y.o.-year-old patient lying in the bed lethargic and moaning.  EYES: Pupils equal, round, reactive to light and accommodation. No scleral icterus. Extraocular muscles intact.  HEENT: Head atraumatic, normocephalic. Oropharynx and nasopharynx clear. No oropharyngeal erythema, dry oral mucosa  NECK:  Supple, no jugular venous distention. No thyroid enlargement, no tenderness.  LUNGS: Normal breath sounds bilaterally, no wheezing, rales, rhonchi. No use of accessory muscles of respiration.  CARDIOVASCULAR: S1, S2, Irregular. No murmurs, rubs, gallops, clicks.  ABDOMEN: Soft, nontender, nondistended. Bowel sounds present. No organomegaly or mass.  EXTREMITIES: No pedal edema, cyanosis, or clubbing. + 2 pedal & radial pulses b/l.  Left upper extremity in a sling from recent humeral fracture. NEUROLOGIC: Cranial nerves II through XII are intact. No focal  Motor or sensory deficits appreciated b/l.  Globally weak PSYCHIATRIC: The patient is alert and oriented x 1.  SKIN: No obvious rash, lesion, or ulcer.   LABORATORY PANEL:   CBC  Recent Labs Lab 07/09/2016 1406  WBC 16.9*  HGB 7.0*  HCT 22.3*  PLT 622*   ------------------------------------------------------------------------------------------------------------------  Chemistries   Recent Labs Lab 07/20/2016 1406  NA 133*  K 5.3*  CL 99*  CO2 21*  GLUCOSE 143*  BUN 67*  CREATININE 2.63*  CALCIUM 9.3  AST 38  ALT 14  ALKPHOS 107  BILITOT 2.4*   ------------------------------------------------------------------------------------------------------------------  Cardiac Enzymes  Recent Labs Lab 07/12/2016 1406  TROPONINI <0.03   ------------------------------------------------------------------------------------------------------------------  RADIOLOGY:  Dg Chest Port 1 View  Result Date: 07/19/2016 CLINICAL DATA:  Ems from home , unresponsive, cool /clammy ,  family at scene, pt received no meds today , EMS administered 1 mg Narcan pt became responsive, AMS , moaning/ groaning " help, help " , , pt with DX of left shoulder fx x1 week ago. EXAM: PORTABLE CHEST 1 VIEW COMPARISON:  12/24/2015 FINDINGS: The heart is enlarged. Aorta is tortuous. There is mild pulmonary vascular congestion but no overt edema. There are no focal consolidations. Fracture of the proximal left humerus. Chronic changes are identified in both shoulders. IMPRESSION: Cardiomegaly. Electronically Signed   By: Nolon Nations M.D.   On: 08/02/2016 16:11     IMPRESSION AND PLAN:   75 year old female with past medical history of dementia, chronic atrial fibrillation, hypertension, depression, anxiety, urinary incontinence who presents to the hospital due to altered mental status and also noted to be hypoxic and in AKI.    1. Sepsis-this is a presumed clinical diagnosis given the patient's tachycardia,  low-grade fever, elevated lactic acid. -The source of sepsis is currently unclear presently. Patient was initially hypoxic although her chest x-ray is normal. -Empirically treat her with IV vancomycin, Zosyn and follow blood, urine cultures. -Follow clinically.  2. Symptomatic anemia-patient noted to have a hemoglobin of 7.0. No previous hemoglobin was close to 9. -She was tachycardic. We'll transfuse her 1 unit of packed red blood cells and follow hemoglobin.  3. Acute kidney injury-secondary to the dehydration, sepsis. -We'll hydrate her with IV fluids, follow BUN and creatinine. Hold Lasix.  4. History of chronic atrial fibrillation-rates are currently uncontrolled given her sepsis. -Continue metoprolol, Cardizem. Continue Eliquis.  5. Dementia-continue Aricept  6. Depression/Anxiety-continue Prozac, Xanax.   7. Hx of Urinary Incontinence - cont. Oxybutynin.      All the records are reviewed and case discussed with ED provider. Management plans discussed with the patient, family and they are in agreement.  CODE STATUS: Full code  TOTAL TIME TAKING CARE OF THIS PATIENT: 45 minutes.    Henreitta Leber M.D on 07/13/2016 at 5:08 PM  Between 7am to 6pm - Pager - [phone]  After 6pm go to [email or web address] - password [email or web address]  Office  [phone]  CC: Primary care physician; Idelle Crouch, MD

## 2016-07-28 ENCOUNTER — Inpatient Hospital Stay: Payer: Medicare Other

## 2016-07-28 LAB — CBC
HEMATOCRIT: 24.4 % — AB (ref 35.0–47.0)
HEMOGLOBIN: 7.9 g/dL — AB (ref 12.0–16.0)
MCH: 24.3 pg — ABNORMAL LOW (ref 26.0–34.0)
MCHC: 32.3 g/dL (ref 32.0–36.0)
MCV: 75.2 fL — ABNORMAL LOW (ref 80.0–100.0)
Platelets: 531 10*3/uL — ABNORMAL HIGH (ref 150–440)
RBC: 3.24 MIL/uL — AB (ref 3.80–5.20)
RDW: 23 % — ABNORMAL HIGH (ref 11.5–14.5)
WBC: 18 10*3/uL — ABNORMAL HIGH (ref 3.6–11.0)

## 2016-07-28 LAB — BASIC METABOLIC PANEL
ANION GAP: 9 (ref 5–15)
BUN: 67 mg/dL — ABNORMAL HIGH (ref 6–20)
CO2: 21 mmol/L — AB (ref 22–32)
Calcium: 8.1 mg/dL — ABNORMAL LOW (ref 8.9–10.3)
Chloride: 105 mmol/L (ref 101–111)
Creatinine, Ser: 2.11 mg/dL — ABNORMAL HIGH (ref 0.44–1.00)
GFR, EST AFRICAN AMERICAN: 25 mL/min — AB (ref >60)
GFR, EST NON AFRICAN AMERICAN: 21 mL/min — AB (ref >60)
GLUCOSE: 116 mg/dL — AB (ref 65–99)
POTASSIUM: 4.7 mmol/L (ref 3.5–5.1)
Sodium: 135 mmol/L (ref 135–145)

## 2016-07-28 LAB — HEMOGLOBIN: HEMOGLOBIN: 8 g/dL — AB (ref 12.0–16.0)

## 2016-07-28 MED ORDER — AMIODARONE HCL IN DEXTROSE 360-4.14 MG/200ML-% IV SOLN
60.0000 mg/h | INTRAVENOUS | Status: DC
  Administered 2016-07-28 – 2016-07-30 (×5): 60 mg/h via INTRAVENOUS
  Filled 2016-07-28 (×7): qty 200

## 2016-07-28 MED ORDER — HYDROMORPHONE HCL 1 MG/ML IJ SOLN
0.5000 mg | INTRAMUSCULAR | Status: DC | PRN
  Administered 2016-07-28: 0.5 mg via INTRAVENOUS
  Filled 2016-07-28: qty 1

## 2016-07-28 MED ORDER — HYDROMORPHONE HCL 1 MG/ML IJ SOLN
1.0000 mg | INTRAMUSCULAR | Status: DC | PRN
  Administered 2016-07-28: 1 mg via INTRAVENOUS
  Filled 2016-07-28 (×2): qty 1

## 2016-07-28 MED ORDER — PIPERACILLIN-TAZOBACTAM 3.375 G IVPB
3.3750 g | Freq: Three times a day (TID) | INTRAVENOUS | Status: DC
  Administered 2016-07-28 – 2016-07-29 (×4): 3.375 g via INTRAVENOUS
  Filled 2016-07-28 (×4): qty 50

## 2016-07-28 MED ORDER — AMIODARONE LOAD VIA INFUSION
150.0000 mg | Freq: Once | INTRAVENOUS | Status: AC
  Administered 2016-07-28: 150 mg via INTRAVENOUS
  Filled 2016-07-28: qty 83.34

## 2016-07-28 MED ORDER — SODIUM CHLORIDE 0.9 % IV BOLUS (SEPSIS)
500.0000 mL | INTRAVENOUS | Status: DC | PRN
  Administered 2016-07-28 (×2): 500 mL via INTRAVENOUS
  Filled 2016-07-28 (×2): qty 500

## 2016-07-28 MED ORDER — SODIUM CHLORIDE 0.9 % IV BOLUS (SEPSIS)
1000.0000 mL | INTRAVENOUS | Status: DC | PRN
  Administered 2016-07-29: 1000 mL via INTRAVENOUS
  Filled 2016-07-28: qty 1000

## 2016-07-28 MED ORDER — ORAL CARE MOUTH RINSE
15.0000 mL | Freq: Two times a day (BID) | OROMUCOSAL | Status: DC
  Administered 2016-07-28 (×3): 15 mL via OROMUCOSAL

## 2016-07-28 MED ORDER — AMIODARONE HCL IN DEXTROSE 360-4.14 MG/200ML-% IV SOLN
60.0000 mg/h | INTRAVENOUS | Status: AC
  Administered 2016-07-28: 60 mg/h via INTRAVENOUS
  Filled 2016-07-28 (×3): qty 200

## 2016-07-28 NOTE — Progress Notes (Signed)
[place] at [facility]                                                                                                                                                                                            Patient Demographics   Joyce Horton, is a 75 y.o. female, DOB - Feb 25, 1941, [id number]  Admit date - 07/07/2016   Admitting Physician Henreitta Leber, MD  Outpatient Primary MD for the patient is SPARKS,JEFFREY D, MD   LOS - 1  Subjective: Patient is admitted with suspected sepsis. Overnight she is noticed to have a heart rate in the 140s. According to her she has been screaming in pain. Patient is confused and unable to provide any review of systems    Review of Systems:   CONSTITUTIONAL:Unable to provide review of systems due to her confusion  Vitals:   Vitals:   07/28/16 1145 07/28/16 1200 07/28/16 1300 07/28/16 1400  BP: 107/64 97/62 111/76 114/70  Pulse: (!) 131 (!) 130 (!) 125 (!) 119  Resp: (!) 26 18 (!) 21 (!) 23  Temp:      TempSrc:      SpO2: 98% 99% 99% 97%  Weight:      Height:        Wt Readings from Last 3 Encounters:  07/15/2016 166 lb 8 oz (75.5 kg)  07/20/16 184 lb (83.5 kg)  07/10/16 184 lb (83.5 kg)     Intake/Output Summary (Last 24 hours) at 07/28/16 1517 Last data filed at 07/28/16 1300  Gross per 24 hour  Intake          2074.46 ml  Output                0 ml  Net          2074.46 ml    Physical Exam:   GENERAL: Critically ill appearing uncomfortable HEAD, EYES, EARS, NOSE AND THROAT: Atraumatic, normocephalic. Pupils equal and reactive to light. Sclerae anicteric. No conjunctival injection. No oro-pharyngeal erythema.  NECK: Supple. There is no jugular venous distention. No bruits, no lymphadenopathy, no thyromegaly.  HEART: Irregularly irregular rhythm No murmurs, no rubs, no clicks.  LUNGS: Clear to auscultation bilaterally. No rales or rhonchi. No wheezes.  ABDOMEN: Tender to touch  diminished bowel sounds unclear if she has any guarding  EXTREMITIES: No evidence of any cyanosis, clubbing, or peripheral edema.  +2 pedal and radial pulses bilaterally.  NEUROLOGIC: The patient is a sleepy she received some pain medications earlier SKIN: Moist and warm with no rashes appreciated.  Psych: Not anxious, depressed LN: No inguinal LN  enlargement    Antibiotics   Anti-infectives    Start     Dose/Rate Route Frequency Ordered Stop   07/28/16 1700  vancomycin (VANCOCIN) IVPB 750 mg/150 ml premix     750 mg 150 mL/hr over 60 Minutes Intravenous Every 36 hours 07/06/2016 2012     07/28/16 0930  piperacillin-tazobactam (ZOSYN) IVPB 3.375 g     3.375 g 12.5 mL/hr over 240 Minutes Intravenous Every 8 hours 07/28/16 0900     07/28/2016 1545  vancomycin (VANCOCIN) IVPB 1000 mg/200 mL premix     1,000 mg 200 mL/hr over 60 Minutes Intravenous  Once 07/23/2016 1539 07/08/2016 1703   07/23/2016 1545  piperacillin-tazobactam (ZOSYN) IVPB 3.375 g     3.375 g 100 mL/hr over 30 Minutes Intravenous  Once 07/18/2016 1539 07/19/2016 1629   07/13/2016 0400  piperacillin-tazobactam (ZOSYN) IVPB 3.375 g  Status:  Discontinued     3.375 g 12.5 mL/hr over 240 Minutes Intravenous Every 12 hours 07/25/2016 2010 07/28/16 0900      Medications   Scheduled Meds: . mouth rinse  15 mL Mouth Rinse BID  . oxybutynin  5 mg Oral BID  . piperacillin-tazobactam (ZOSYN)  IV  3.375 g Intravenous Q8H  . sodium chloride flush  3 mL Intravenous Q12H  . vancomycin  750 mg Intravenous Q36H   Continuous Infusions: . sodium chloride 100 mL/hr at 07/28/16 1300  . amiodarone 60 mg/hr (07/28/16 1300)   Followed by  . amiodarone     PRN Meds:.acetaminophen **OR** acetaminophen, HYDROmorphone (DILAUDID) injection, morphine injection, ondansetron **OR** ondansetron (ZOFRAN) IV   Data Review:   Micro Results Recent Results (from the past 240 hour(s))  Culture, blood (routine x 2)     Status: None (Preliminary result)    Collection Time: 08/01/2016  2:04 PM  Result Value Ref Range Status   Specimen Description BLOOD LEFT HAND  Final   Special Requests BOTTLES DRAWN AEROBIC AND ANAEROBIC 1CC  Final   Culture NO GROWTH < 24 HOURS  Final   Report Status PENDING  Incomplete  Culture, blood (routine x 2)     Status: None (Preliminary result)   Collection Time: 07/15/2016  2:06 PM  Result Value Ref Range Status   Specimen Description BLOOD RIGHT ASSIST CONTROL  Final   Special Requests BOTTLES DRAWN AEROBIC AND ANAEROBIC 5CC  Final   Culture NO GROWTH < 24 HOURS  Final   Report Status PENDING  Incomplete  Urine culture     Status: Abnormal (Preliminary result)   Collection Time: 07/14/2016  2:47 PM  Result Value Ref Range Status   Specimen Description URINE, CLEAN CATCH  Final   Special Requests NONE  Final   Culture >=100,000 COLONIES/mL GRAM NEGATIVE RODS (A)  Final   Report Status PENDING  Incomplete  MRSA PCR Screening     Status: None   Collection Time: 07/19/2016  7:00 PM  Result Value Ref Range Status   MRSA by PCR NEGATIVE NEGATIVE Final    Comment:        The GeneXpert MRSA Assay (FDA approved for NASAL specimens only), is one component of a comprehensive MRSA colonization surveillance program. It is not intended to diagnose MRSA infection nor to guide or monitor treatment for MRSA infections.     Radiology Reports Dg Thoracic Spine 2 View  Result Date: 07/10/2016 CLINICAL DATA:  Kyphoplasty. EXAM: DG C-ARM 61-120 MIN; THORACIC SPINE 2 VIEWS RADIATION EXPOSURE INDEX: 11.2 mGy. FLUOROSCOPY TIME:  2 minutes 13 seconds.  COMPARISON:  None. FINDINGS: Four intraoperative fluoroscopic images of the thoracic spine were obtained. These demonstrate kyphoplasty being performed of a thoracic vertebral body. IMPRESSION: Kyphoplasty of thoracic vertebral body. Electronically Signed   By: Marijo Conception, M.D.   On: 07/10/2016 15:47   Ct Head Wo Contrast  Result Date: 07/20/2016 CLINICAL DATA:  Fall on  Eliquis. Initial encounter. EXAM: CT HEAD WITHOUT CONTRAST TECHNIQUE: Contiguous axial images were obtained from the base of the skull through the vertex without intravenous contrast. COMPARISON:  04/24/2016 FINDINGS: Brain: No evidence of acute infarction, hemorrhage, hydrocephalus, extra-axial collection or mass lesion/mass effect. Moderate generalized atrophy and chronic microvascular ischemic change in the cerebral white matter. Vascular: Atherosclerotic calcification. Skull: Negative for fracture Sinuses/Orbits: Chronic right sphenoid sinusitis with near complete opacification. IMPRESSION: 1. No acute finding. 2. Moderate atrophy and chronic microvascular disease. 3. Chronic right sphenoid sinusitis. Electronically Signed   By: Monte Fantasia M.D.   On: 07/20/2016 15:27   Mr Thoracic Spine Wo Contrast  Result Date: 07/08/2016 CLINICAL DATA:  Back pain for several days.  Status post fall. EXAM: MRI THORACIC SPINE WITHOUT CONTRAST TECHNIQUE: Multiplanar, multisequence MR imaging of the thoracic spine was performed. No intravenous contrast was administered. COMPARISON:  None. FINDINGS: Alignment: No static listhesis. Dextrocurvature of the thoracic spine. Vertebrae: T11 vertebral body compression fracture with approximately 15% height loss and marrow edema within the vertebral body most consistent with an acute - subacute compression fracture. No significant retropulsion. Chronic T10 vertebral body compression fracture without associated marrow edema. 2 mm of retropulsion of the superior posterior margin of the T10 vertebral body. The remainder the vertebral body heights are maintained. No discitis or aggressive osseous lesion. Cord:  Normal signal and morphology. Paraspinal and other soft tissues: No focal paraspinal abnormality. Two 12 mm T2 hyperintense left renal masses most consistent with cysts. Small bilateral pleural effusions. Disc levels: Disc spaces: Degenerative disc disease with disc height loss  at C5-6, C6-7 and C7-T1 on the sagittal scout images. Mild degenerative disc disease with disc height loss at T2-3, T3-4 and T4-5. Mild degenerative disc disease at T11-12, T12-L1. T1-T2: No disc protrusion, foraminal stenosis or central canal stenosis. T2-T3: No disc protrusion, foraminal stenosis or central canal stenosis. T3-T4: No disc protrusion, foraminal stenosis or central canal stenosis. T4-T5: No disc protrusion, foraminal stenosis or central canal stenosis. T5-T6: No disc protrusion, foraminal stenosis or central canal stenosis. T6-T7: No disc protrusion, foraminal stenosis or central canal stenosis. T7-T8: No disc protrusion, foraminal stenosis or central canal stenosis. T8-T9: No disc protrusion, foraminal stenosis or central canal stenosis. T9-T10: No disc protrusion, foraminal stenosis or central canal stenosis. T10-T11: No disc protrusion, foraminal stenosis or central canal stenosis. T11-T12: No disc protrusion, foraminal stenosis or central canal stenosis. IMPRESSION: 1. Acute-subacute T11 vertebral body compression fracture with approximately 15% height loss. Electronically Signed   By: Kathreen Devoid   On: 07/08/2016 12:05   Ct Shoulder Left Wo Contrast  Result Date: 07/25/2016 CLINICAL DATA:  Left shoulder fracture 5 days ago after fall. EXAM: CT OF THE LEFT SHOULDER WITHOUT CONTRAST TECHNIQUE: Multidetector CT imaging was performed according to the standard protocol. Multiplanar CT image reconstructions were also generated. COMPARISON:  07/20/2016 FINDINGS: Despite efforts by the technologist and patient, motion artifact is present on today's exam and could not be eliminated. This reduces exam sensitivity and specificity. Surgical and anatomic neck fracture of the left proximal humerus with multiple small fragments along the fracture plane, and nondisplaced fracture planes extending  into the greater and lesser tuberosities. The humeral head is markedly abducted. Of particular note, there is  marked anterior displacement of the humeral shaft with respect to the humeral head, with the shaft displaced about 3.3 cm anteriorly with thigh sharp head shin abutting the pectoralis musculature. Active expected there is considerable hematoma in this region. Severe degenerative glenohumeral arthropathy. Aortic arch atherosclerosis. IMPRESSION: 1. Markedly displaced 2-part surgical neck fracture, with the humeral shaft displaced about 3.3 cm anterior to the humeral head. The fractured margin of the shaft abuts the pectoralis musculature anteriorly. There is likely an anatomic neck fracture of the humerus as well, with multiple small bony fragments along the humeral head fracture plane, and with nondisplaced fracture planes extending into the greater and lesser tuberosities of the humerus. The humeral head is prominently abducted. 2. Severe degenerative glenohumeral arthropathy. 3. Aortic arch atherosclerosis. 4. Despite efforts by the technologist and patient, motion artifact is present on today's exam and could not be eliminated. This reduces exam sensitivity and specificity. Electronically Signed   By: Van Clines M.D.   On: 07/25/2016 16:08   Dg Chest Port 1 View  Result Date: 07/09/2016 CLINICAL DATA:  Ems from home , unresponsive, cool /clammy , family at scene, pt received no meds today , EMS administered 1 mg Narcan pt became responsive, AMS , moaning/ groaning " help, help " , , pt with DX of left shoulder fx x1 week ago. EXAM: PORTABLE CHEST 1 VIEW COMPARISON:  12/24/2015 FINDINGS: The heart is enlarged. Aorta is tortuous. There is mild pulmonary vascular congestion but no overt edema. There are no focal consolidations. Fracture of the proximal left humerus. Chronic changes are identified in both shoulders. IMPRESSION: Cardiomegaly. Electronically Signed   By: Nolon Nations M.D.   On: 07/07/2016 16:11   Dg Humerus Left  Result Date: 07/20/2016 CLINICAL DATA:  Pt states she was reaching  around the refrigerator door and lost her balance and fell injuring her left upper arm. Also states she just had surgery on her back for a compression fx a week ago. EXAM: LEFT HUMERUS - 2+ VIEW COMPARISON:  None. FINDINGS: Markedly displaced fracture of the left humeral neck, with overriding of the main fracture fragments and probable impaction at the fracture site. Main component of the humeral head appears to remain well-positioned relative to the glenoid fossa. Distal humerus is intact and normally aligned. IMPRESSION: Displaced fracture of the left humeral neck, as detailed above. Electronically Signed   By: Franki Cabot M.D.   On: 07/20/2016 14:12   Dg C-arm 1-60 Min  Result Date: 07/10/2016 CLINICAL DATA:  Kyphoplasty. EXAM: DG C-ARM 61-120 MIN; THORACIC SPINE 2 VIEWS RADIATION EXPOSURE INDEX: 11.2 mGy. FLUOROSCOPY TIME:  2 minutes 13 seconds. COMPARISON:  None. FINDINGS: Four intraoperative fluoroscopic images of the thoracic spine were obtained. These demonstrate kyphoplasty being performed of a thoracic vertebral body. IMPRESSION: Kyphoplasty of thoracic vertebral body. Electronically Signed   By: Marijo Conception, M.D.   On: 07/10/2016 15:47     CBC  Recent Labs Lab  1406 07/28/16 0012 07/28/16 0410  WBC 16.9*  --  18.0*  HGB 7.0* 8.0* 7.9*  HCT 22.3*  --  24.4*  PLT 622*  --  531*  MCV 75.4*  --  75.2*  MCH 23.7*  --  24.3*  MCHC 31.5*  --  32.3  RDW 25.3*  --  23.0*  LYMPHSABS 1.1  --   --   MONOABS 0.9  --   --  EOSABS 0.0  --   --   BASOSABS 0.0  --   --     Chemistries   Recent Labs Lab 07/11/2016 1406 07/28/16 0410  NA 133* 135  K 5.3* 4.7  CL 99* 105  CO2 21* 21*  GLUCOSE 143* 116*  BUN 67* 67*  CREATININE 2.63* 2.11*  CALCIUM 9.3 8.1*  AST 38  --   ALT 14  --   ALKPHOS 107  --   BILITOT 2.4*  --    ------------------------------------------------------------------------------------------------------------------ estimated creatinine clearance  is 20.3 mL/min (by C-G formula based on SCr of 2.11 mg/dL (H)). ------------------------------------------------------------------------------------------------------------------ No results for input(s): HGBA1C in the last 72 hours. ------------------------------------------------------------------------------------------------------------------ No results for input(s): CHOL, HDL, LDLCALC, TRIG, CHOLHDL, LDLDIRECT in the last 72 hours. ------------------------------------------------------------------------------------------------------------------ No results for input(s): TSH, T4TOTAL, T3FREE, THYROIDAB in the last 72 hours.  Invalid input(s): FREET3 ------------------------------------------------------------------------------------------------------------------ No results for input(s): VITAMINB12, FOLATE, FERRITIN, TIBC, IRON, RETICCTPCT in the last 72 hours.  Coagulation profile No results for input(s): INR, PROTIME in the last 168 hours.  No results for input(s): DDIMER in the last 72 hours.  Cardiac Enzymes  Recent Labs Lab 07/17/2016 1406  TROPONINI <0.03   ------------------------------------------------------------------------------------------------------------------ Invalid input(s): POCBNP    Assessment & Plan   75 year old female with past medical history of dementia, chronic atrial fibrillation, hypertension, depression, anxiety, urinary incontinence who presents to the hospital due to altered mental status and also noted to be hypoxic and in AKI.    1. Sepsis- Unclear source however with her abdomen tenderness have concern about a GI source. Once her heart rate is stable we can get a CT of the abdomen and pelvis without IV contrast     2. Symptomatic anemia-status post transfusion repeat hemoglobin and transfuse as needed Discontinue L up with  3. Acute kidney injury-secondary to the dehydration, sepsis. Continue IV fluids monitor renal function  4.  Atrial fibrillation with rapid ventricular rate under poor control I will place patient on amiodarone drip.  5. Dementia-continue Aricept  6. Depression/Anxiety-continue Prozac, Xanax.   7. Hx of Urinary Incontinence - cont. Oxybutynin.        Code Status Orders        Start     Ordered   08/03/2016 1938  Full code  Continuous     08/05/2016 1937    Code Status History    Date Active Date Inactive Code Status Order ID Comments User Context   07/10/2016  5:00 PM 07/10/2016  8:45 PM Full Code [id number]  Hessie Knows, MD Inpatient   12/24/2015  3:15 PM 12/27/2015  3:27 PM Full Code [id number]  Hillary Bow, MD ED           Consults  Cardiology   DVT Prophylaxis  SCDs  Lab Results  Component Value Date   PLT 531 (H) 07/28/2016     Time Spent in minutes  43min  Greater than 50% of time spent in care coordination and counseling patient regarding the condition and plan of care.   Dustin Flock M.D on 07/28/2016 at 3:17 PM  Between 7am to 6pm - Pager - [phone]  After 6pm go to [email or web address] - password [email or web address] [facility]   Office  [phone]

## 2016-07-28 NOTE — Progress Notes (Signed)
RN spoke with Dr. Posey Pronto on the phone and made MD aware that patient continues to holler out moaning in pain and was given morphine at 0900 and can not have it again until 1300.  Dr. Posey Pronto stated " give her 1-2 mg of dilaudid every 3 hours PRN for severe pain."

## 2016-07-28 NOTE — Progress Notes (Signed)
Pharmacy Antibiotic Note  Joyce Horton is a 75 y.o. female admitted on 07/20/2016 with sepsis.  Pharmacy has been consulted for vancomycin and piperacillin/tazobactam dosing.  Plan: Continue vancomycin 750mg  IV Q36hr for goal trough of 15-20. Will continue to monitor renal function daily. Will order trough as clinically indicated.    Continue piperacillin/tazobactam EI 3.375g IV Q8hr.    Height: 5' (152.4 cm) Weight: 166 lb 8 oz (75.5 kg) IBW/kg (Calculated) : 45.5  Temp (24hrs), Avg:98.9 F (37.2 C), Min:98.4 F (36.9 C), Max:100.5 F (38.1 C)   Recent Labs Lab 07/11/2016 1404 07/26/2016 1406 07/13/2016 1747 07/28/16 0410  WBC  --  16.9*  --  18.0*  CREATININE  --  2.63*  --  2.11*  LATICACIDVEN 4.1*  --  2.0*  --     Estimated Creatinine Clearance: 20.3 mL/min (by C-G formula based on SCr of 2.11 mg/dL (H)).    Allergies  Allergen Reactions  . Fentanyl Other (See Comments)    Hallucinations and mental alterations    Antimicrobials this admission: vancomycin 10/22 >>  Piperacillin-tazobactam  10/22 >>   Dose adjustments this admission: N/A  Microbiology results: 10/22 BCx: no growth < 24 hours 10/22 UCx: sent  10/22 MRSA PCR: negative    Pharmacy will continue to monitor and adjust per consult.   Destine Ambroise L 07/28/2016 1:53 PM

## 2016-07-28 NOTE — Progress Notes (Signed)
Spoke to Dr. Jannifer Franklin regarding patient's soft BP and pain management.  Patient's pain remains uncontrolled, however BP continues to drop with SBP 70s-80s. Orders received for PRN 534ml boluses for SBP<90.  Patient's pain medications changed.  Will continue to monitor closely.

## 2016-07-28 NOTE — Consult Note (Signed)
[facility] Cardiology  CARDIOLOGY CONSULT NOTE  Patient ID: Joyce Horton MRN: [id number] DOB/AGE: March 31, 1941 75 y.o.  Admit date: 08/02/2016 Referring Physician Verdell Carmine Primary Physician [facility] Primary Cardiologist Nehemiah Massed Reason for Consultation Atrial fibrillation  HPI: 75 year old female referred for evaluation of atrial fibrillation. The patient has known history of chronic atrial fibrillation. The patient presents to [facility] emergency room with mental status, poor by mouth intake, and inability to urinate, with fever and tachycardia and hypoxia, consistent with urosepsis. In the emergency room patient noted to be in atrial fibrillation with a rapid ventricular rate, started on amiodarone drip, with reasonable control of heart rate. Heart rate exacerbated by anemia. Admission labs notable for elevated white count of 18,000.  Review of systems complete and found to be negative unless listed above     Past Medical History:  Diagnosis Date  . Anemia   . Atrial fibrillation ([place])   . 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 Neoadjuvant chemotherapy. Wide local excision, sentinel node biopsy completed May 2011 followed by whole breast radiation.  . Fall 2012  . GERD (gastroesophageal reflux disease)   . History of chemotherapy   . Hypertension   . Obesity   . Osteoporosis   . Ulcer [facility])     Past Surgical History:  Procedure Laterality Date  . BREAST BIOPSY  2000,2007,2010   left breast 2010  . BREAST SURGERY Left 2011   lumpectomy  . COLONOSCOPY  2002,2007,2010   [place]  . KYPHOPLASTY N/A 07/10/2016   Procedure: KYPHOPLASTY  T 11;  Surgeon: Hessie Knows, MD;  Location: [facility] ORS;  Service: Orthopedics;  Laterality: N/A;  . SKIN LESION EXCISION  [id number]  . stents     R leg  . TIBIA FRACTURE SURGERY Right July 2013   right femur fracture with internal fixation.    Prescriptions Prior to Admission  Medication Sig Dispense Refill Last  Dose  . acetaminophen (TYLENOL) 500 MG tablet Take 500 mg by mouth every 6 (six) hours as needed for moderate pain or fever.    PRN at PRN  . ALPRAZolam (XANAX) 0.25 MG tablet Take 0.25 mg by mouth at bedtime as needed for anxiety.   PRN at PRN  . apixaban (ELIQUIS) 5 MG TABS tablet Take 1 tablet (5 mg total) by mouth 2 (two) times daily. 180 tablet 0 07/26/2016 at 2200  . Calcium-Vitamin D (CALTRATE 600 PLUS-VIT D PO) Take 1 tablet by mouth daily.    07/26/2016 at am  . cetirizine (ZYRTEC) 10 MG tablet Take 10 mg by mouth daily as needed for allergies.   PRN at PRN  . diltiazem (CARDIZEM CD) 180 MG 24 hr capsule Take 1 capsule (180 mg total) by mouth daily. 90 capsule 0 07/26/2016 at am  . donepezil (ARICEPT) 10 MG tablet Take 10 mg by mouth at bedtime.  12 07/26/2016 at pm  . FLUoxetine (PROZAC) 20 MG tablet Take 20 mg by mouth daily.   07/26/2016 at pm  . furosemide (LASIX) 40 MG tablet Take 40 mg by mouth daily.   07/26/2016 at am  . HYDROcodone-acetaminophen (NORCO/VICODIN) 5-325 MG tablet Take 1 tablet by mouth every 6 (six) hours as needed for moderate pain. 20 tablet 0 PRN at PRN  . metoprolol (LOPRESSOR) 100 MG tablet Take 1 tablet (100 mg total) by mouth 2 (two) times daily. 60 tablet 0 07/26/2016 at 2200  . Multiple Vitamins-Minerals (MULTIVITAMIN WITH MINERALS) tablet Take 1 tablet by mouth daily.  07/26/2016 at am  . naproxen sodium (ANAPROX) 220 MG tablet Take 220 mg by mouth 2 (two) times daily as needed. For pain   PRN at PRN  . oxybutynin (DITROPAN) 5 MG tablet Take 5 mg by mouth 2 (two) times daily.   07/26/2016 at pm  . potassium chloride SA (K-DUR,KLOR-CON) 20 MEQ tablet Take 20 mEq by mouth daily.   07/26/2016 at am   Social History   Social History  . Marital status: Widowed    Spouse name: N/A  . Number of children: N/A  . Years of education: N/A   Occupational History  . Not on file.   Social History Main Topics  . Smoking status: Never Smoker  . Smokeless  tobacco: Never Used  . Alcohol use 0.0 oz/week  . Drug use: No  . Sexual activity: Not on file   Other Topics Concern  . Not on file   Social History Narrative  . No narrative on file    Family History  Problem Relation Age of Onset  . Cancer Mother     breast  . Kidney failure Mother   . CAD Father   . Heart failure Father       Review of systems complete and found to be negative unless listed above      PHYSICAL EXAM  General: Well developed, well nourished, in no acute distress HEENT:  Normocephalic and atramatic Neck:  No JVD.  Lungs: Clear bilaterally to auscultation and percussion. Heart: HRRR . Normal S1 and S2 without gallops or murmurs.  Abdomen: Bowel sounds are positive, abdomen soft and non-tender  Msk:  Back normal, normal gait. Normal strength and tone for age. Extremities: No clubbing, cyanosis or edema.   Neuro: Alert and oriented X 3. Psych:  Good affect, responds appropriately  Labs:   Lab Results  Component Value Date   WBC 18.0 (H) 07/28/2016   HGB 7.9 (L) 07/28/2016   HCT 24.4 (L) 07/28/2016   MCV 75.2 (L) 07/28/2016   PLT 531 (H) 07/28/2016    Recent Labs Lab 08/05/2016 1406 07/28/16 0410  NA 133* 135  K 5.3* 4.7  CL 99* 105  CO2 21* 21*  BUN 67* 67*  CREATININE 2.63* 2.11*  CALCIUM 9.3 8.1*  PROT 5.7*  --   BILITOT 2.4*  --   ALKPHOS 107  --   ALT 14  --   AST 38  --   GLUCOSE 143* 116*   Lab Results  Component Value Date   TROPONINI <0.03 08/02/2016   No results found for: CHOL No results found for: HDL No results found for: LDLCALC No results found for: TRIG No results found for: CHOLHDL No results found for: LDLDIRECT    Radiology: Dg Thoracic Spine 2 View  Result Date: 07/10/2016 CLINICAL DATA:  Kyphoplasty. EXAM: DG C-ARM 61-120 MIN; THORACIC SPINE 2 VIEWS RADIATION EXPOSURE INDEX: 11.2 mGy. FLUOROSCOPY TIME:  2 minutes 13 seconds. COMPARISON:  None. FINDINGS: Four intraoperative fluoroscopic images of the  thoracic spine were obtained. These demonstrate kyphoplasty being performed of a thoracic vertebral body. IMPRESSION: Kyphoplasty of thoracic vertebral body. Electronically Signed   By: Marijo Conception, M.D.   On: 07/10/2016 15:47   Ct Head Wo Contrast  Result Date: 07/20/2016 CLINICAL DATA:  Fall on Eliquis. Initial encounter. EXAM: CT HEAD WITHOUT CONTRAST TECHNIQUE: Contiguous axial images were obtained from the base of the skull through the vertex without intravenous contrast. COMPARISON:  04/24/2016 FINDINGS: Brain: No evidence of  acute infarction, hemorrhage, hydrocephalus, extra-axial collection or mass lesion/mass effect. Moderate generalized atrophy and chronic microvascular ischemic change in the cerebral white matter. Vascular: Atherosclerotic calcification. Skull: Negative for fracture Sinuses/Orbits: Chronic right sphenoid sinusitis with near complete opacification. IMPRESSION: 1. No acute finding. 2. Moderate atrophy and chronic microvascular disease. 3. Chronic right sphenoid sinusitis. Electronically Signed   By: Monte Fantasia M.D.   On: 07/20/2016 15:27   Mr Thoracic Spine Wo Contrast  Result Date: 07/08/2016 CLINICAL DATA:  Back pain for several days.  Status post fall. EXAM: MRI THORACIC SPINE WITHOUT CONTRAST TECHNIQUE: Multiplanar, multisequence MR imaging of the thoracic spine was performed. No intravenous contrast was administered. COMPARISON:  None. FINDINGS: Alignment: No static listhesis. Dextrocurvature of the thoracic spine. Vertebrae: T11 vertebral body compression fracture with approximately 15% height loss and marrow edema within the vertebral body most consistent with an acute - subacute compression fracture. No significant retropulsion. Chronic T10 vertebral body compression fracture without associated marrow edema. 2 mm of retropulsion of the superior posterior margin of the T10 vertebral body. The remainder the vertebral body heights are maintained. No discitis or  aggressive osseous lesion. Cord:  Normal signal and morphology. Paraspinal and other soft tissues: No focal paraspinal abnormality. Two 12 mm T2 hyperintense left renal masses most consistent with cysts. Small bilateral pleural effusions. Disc levels: Disc spaces: Degenerative disc disease with disc height loss at C5-6, C6-7 and C7-T1 on the sagittal scout images. Mild degenerative disc disease with disc height loss at T2-3, T3-4 and T4-5. Mild degenerative disc disease at T11-12, T12-L1. T1-T2: No disc protrusion, foraminal stenosis or central canal stenosis. T2-T3: No disc protrusion, foraminal stenosis or central canal stenosis. T3-T4: No disc protrusion, foraminal stenosis or central canal stenosis. T4-T5: No disc protrusion, foraminal stenosis or central canal stenosis. T5-T6: No disc protrusion, foraminal stenosis or central canal stenosis. T6-T7: No disc protrusion, foraminal stenosis or central canal stenosis. T7-T8: No disc protrusion, foraminal stenosis or central canal stenosis. T8-T9: No disc protrusion, foraminal stenosis or central canal stenosis. T9-T10: No disc protrusion, foraminal stenosis or central canal stenosis. T10-T11: No disc protrusion, foraminal stenosis or central canal stenosis. T11-T12: No disc protrusion, foraminal stenosis or central canal stenosis. IMPRESSION: 1. Acute-subacute T11 vertebral body compression fracture with approximately 15% height loss. Electronically Signed   By: Kathreen Devoid   On: 07/08/2016 12:05   Ct Shoulder Left Wo Contrast  Result Date: 07/25/2016 CLINICAL DATA:  Left shoulder fracture 5 days ago after fall. EXAM: CT OF THE LEFT SHOULDER WITHOUT CONTRAST TECHNIQUE: Multidetector CT imaging was performed according to the standard protocol. Multiplanar CT image reconstructions were also generated. COMPARISON:  07/20/2016 FINDINGS: Despite efforts by the technologist and patient, motion artifact is present on today's exam and could not be eliminated. This  reduces exam sensitivity and specificity. Surgical and anatomic neck fracture of the left proximal humerus with multiple small fragments along the fracture plane, and nondisplaced fracture planes extending into the greater and lesser tuberosities. The humeral head is markedly abducted. Of particular note, there is marked anterior displacement of the humeral shaft with respect to the humeral head, with the shaft displaced about 3.3 cm anteriorly with thigh sharp head shin abutting the pectoralis musculature. Active expected there is considerable hematoma in this region. Severe degenerative glenohumeral arthropathy. Aortic arch atherosclerosis. IMPRESSION: 1. Markedly displaced 2-part surgical neck fracture, with the humeral shaft displaced about 3.3 cm anterior to the humeral head. The fractured margin of the shaft abuts the pectoralis musculature  anteriorly. There is likely an anatomic neck fracture of the humerus as well, with multiple small bony fragments along the humeral head fracture plane, and with nondisplaced fracture planes extending into the greater and lesser tuberosities of the humerus. The humeral head is prominently abducted. 2. Severe degenerative glenohumeral arthropathy. 3. Aortic arch atherosclerosis. 4. Despite efforts by the technologist and patient, motion artifact is present on today's exam and could not be eliminated. This reduces exam sensitivity and specificity. Electronically Signed   By: Van Clines M.D.   On: 07/25/2016 16:08   Dg Chest Port 1 View  Result Date: 07/20/2016 CLINICAL DATA:  Ems from home , unresponsive, cool /clammy , family at scene, pt received no meds today , EMS administered 1 mg Narcan pt became responsive, AMS , moaning/ groaning " help, help " , , pt with DX of left shoulder fx x1 week ago. EXAM: PORTABLE CHEST 1 VIEW COMPARISON:  12/24/2015 FINDINGS: The heart is enlarged. Aorta is tortuous. There is mild pulmonary vascular congestion but no overt edema.  There are no focal consolidations. Fracture of the proximal left humerus. Chronic changes are identified in both shoulders. IMPRESSION: Cardiomegaly. Electronically Signed   By: Nolon Nations M.D.   On: 07/18/2016 16:11   Dg Humerus Left  Result Date: 07/20/2016 CLINICAL DATA:  Pt states she was reaching around the refrigerator door and lost her balance and fell injuring her left upper arm. Also states she just had surgery on her back for a compression fx a week ago. EXAM: LEFT HUMERUS - 2+ VIEW COMPARISON:  None. FINDINGS: Markedly displaced fracture of the left humeral neck, with overriding of the main fracture fragments and probable impaction at the fracture site. Main component of the humeral head appears to remain well-positioned relative to the glenoid fossa. Distal humerus is intact and normally aligned. IMPRESSION: Displaced fracture of the left humeral neck, as detailed above. Electronically Signed   By: Franki Cabot M.D.   On: 07/20/2016 14:12   Dg C-arm 1-60 Min  Result Date: 07/10/2016 CLINICAL DATA:  Kyphoplasty. EXAM: DG C-ARM 61-120 MIN; THORACIC SPINE 2 VIEWS RADIATION EXPOSURE INDEX: 11.2 mGy. FLUOROSCOPY TIME:  2 minutes 13 seconds. COMPARISON:  None. FINDINGS: Four intraoperative fluoroscopic images of the thoracic spine were obtained. These demonstrate kyphoplasty being performed of a thoracic vertebral body. IMPRESSION: Kyphoplasty of thoracic vertebral body. Electronically Signed   By: Marijo Conception, M.D.   On: 07/10/2016 15:47    EKG: Atrial fibrillation with a rapid ventricular rate  ASSESSMENT AND PLAN:   1. Atrial fibrillation, with compensatory tachycardia due to underlying sepsis, currently on amiodarone drip 2. Probable urosepsis 3. Anemia 4. Acute kidney injury, likely secondary to tension, dehydration, probable ATN  Recommendations  1. Agree with overall current therapy 2. Continue amiodarone drip for rate control for now 3. Consider additional  transfusion to stabilize heart rate and blood pressure    Signed: Metha Kolasa MD,PhD, [facility] 07/28/2016, 4:45 PM

## 2016-07-28 NOTE — Progress Notes (Addendum)
Chaplain rounded the unit to provide a compassionate presence and support to the patient and family. The patient appeared to be sleeping.  The patient's son and granddaughter were sitting at the bedside and indicated they needed no support at the time.Chaplain will make a return visit.  Minerva Fester [phone]

## 2016-07-28 NOTE — Progress Notes (Signed)
Initial Nutrition Assessment  DOCUMENTATION CODES:   Not applicable  INTERVENTION:  -Consider ONS if patient's mentation improves  NUTRITION DIAGNOSIS:   Increased nutrient needs related to acute illness, wound healing as evidenced by estimated needs.  GOAL:   Patient will meet greater than or equal to 90% of their needs  MONITOR:   Diet advancement, Labs, Weight trends, I & O's  REASON FOR ASSESSMENT:   Low Braden    ASSESSMENT:   Joyce Horton  is a 75 y.o. female with a known history of Dementia, osteoporosis, hypertension, history of breast cancer, GERD, recent fall with left humeral fracture resents to the hospital due to altered mental status, poor by mouth intake, not being able to urinate over the past few days.  Joyce Horton has been calling out in pain due to Hip fracture, AMS with a history of dementia. Per RN she received pain medication prior to visit. Was unable to obtain history, no family at bedside.  Patient appears well-nourished upon examination, no evidence of muscle wasting or fat depletions, moderate edema in lower extremities. Her weight was stable for approximately 3 years, but appears patient now exhibits an 18#/9.8% severe wt loss over 1 week from previous admission, which may be related to her hip fx. Per chart it's stated she also had very little PO intake for the past few days. Will provide ONS with an improvement in patient's mentation. Currently NPO Labs and medications reviewed: Tot Bili 2.4 Amiodarone GTT NS @ 114mL/hr   Diet Order:  Diet NPO time specified Except for: Sips with Meds  Skin:  Reviewed, no issues  Last BM:  PTA  Height:   Ht Readings from Last 1 Encounters:  07/28/2016 5' (1.524 m)    Weight:   Wt Readings from Last 1 Encounters:  08/04/2016 166 lb 8 oz (75.5 kg)    Ideal Body Weight:  45.45 kg  BMI:  Body mass index is 32.52 kg/m.  Estimated Nutritional Needs:   Kcal:  1300-1600 calories (25-30 cal/kg  ABW)  Protein:  75-90 gm  Fluid:  >/= 1.3L  EDUCATION NEEDS:   No education needs identified at this time  Joyce Anis. Brinton Brandel, MS, RD LDN Inpatient Clinical Dietitian Pager [phone]

## 2016-07-28 NOTE — Progress Notes (Signed)
Dr. Jannifer Franklin notified of patient's BP which continues to be low and that patient is on 2nd 561ml bolus at this time.  Patient was bladder scanned, only showing 161ml in bladder.  Orders received to give 1L bolus and if BP does not improve, to contact [facility] for pressors.  Will continue to monitor.

## 2016-07-28 NOTE — Progress Notes (Signed)
Dr. Saralyn Pilar present at bedside and discussed with RN amiodarone drip and instructed to continue amiodarone drip at current rate of 60 mg/H  and to not titrate drip down at this time due to heart rate remaining in 120's.

## 2016-07-28 NOTE — Progress Notes (Signed)
Dr. Jannifer Franklin notified d/t pt with c/o pain and no pain med ordered. He ordered pain med and I also clarified how many units of blood the pt was to receive, he stated 1 unit of blood is to be transfused at this time.

## 2016-07-29 ENCOUNTER — Inpatient Hospital Stay: Payer: Medicare Other

## 2016-07-29 DIAGNOSIS — G9340 Encephalopathy, unspecified: Secondary | ICD-10-CM

## 2016-07-29 DIAGNOSIS — R6520 Severe sepsis without septic shock: Secondary | ICD-10-CM

## 2016-07-29 DIAGNOSIS — R932 Abnormal findings on diagnostic imaging of liver and biliary tract: Secondary | ICD-10-CM

## 2016-07-29 DIAGNOSIS — A419 Sepsis, unspecified organism: Principal | ICD-10-CM

## 2016-07-29 DIAGNOSIS — I4891 Unspecified atrial fibrillation: Secondary | ICD-10-CM

## 2016-07-29 DIAGNOSIS — R6521 Severe sepsis with septic shock: Secondary | ICD-10-CM

## 2016-07-29 DIAGNOSIS — E872 Acidosis: Secondary | ICD-10-CM

## 2016-07-29 DIAGNOSIS — N179 Acute kidney failure, unspecified: Secondary | ICD-10-CM

## 2016-07-29 LAB — CBC WITH DIFFERENTIAL/PLATELET
BAND NEUTROPHILS: 8 %
BASOS ABS: 0 10*3/uL (ref 0–0.1)
BASOS PCT: 0 %
BLASTS: 0 %
EOS ABS: 0 10*3/uL (ref 0–0.7)
Eosinophils Relative: 0 %
HCT: 25.3 % — ABNORMAL LOW (ref 35.0–47.0)
Hemoglobin: 7.5 g/dL — ABNORMAL LOW (ref 12.0–16.0)
LYMPHS PCT: 6 %
Lymphs Abs: 1.7 10*3/uL (ref 1.0–3.6)
MCH: 23.5 pg — ABNORMAL LOW (ref 26.0–34.0)
MCHC: 29.4 g/dL — AB (ref 32.0–36.0)
MCV: 79.9 fL — ABNORMAL LOW (ref 80.0–100.0)
METAMYELOCYTES PCT: 3 %
MONO ABS: 0.3 10*3/uL (ref 0.2–0.9)
MONOS PCT: 1 %
Myelocytes: 0 %
NEUTROS ABS: 25.6 10*3/uL — AB (ref 1.4–6.5)
Neutrophils Relative %: 82 %
OTHER: 0 %
PROMYELOCYTES ABS: 0 %
Platelets: 483 10*3/uL — ABNORMAL HIGH (ref 150–440)
RBC: 3.17 MIL/uL — ABNORMAL LOW (ref 3.80–5.20)
RDW: 24.3 % — AB (ref 11.5–14.5)
WBC: 27.6 10*3/uL — ABNORMAL HIGH (ref 3.6–11.0)
nRBC: 5 /100 WBC — ABNORMAL HIGH

## 2016-07-29 LAB — URINE CULTURE

## 2016-07-29 LAB — BLOOD GAS, ARTERIAL
ACID-BASE DEFICIT: 12.7 mmol/L — AB (ref 0.0–2.0)
ACID-BASE DEFICIT: 14 mmol/L — AB (ref 0.0–2.0)
Acid-base deficit: 11 mmol/L — ABNORMAL HIGH (ref 0.0–2.0)
BICARBONATE: 11.7 mmol/L — AB (ref 20.0–28.0)
BICARBONATE: 16 mmol/L — AB (ref 20.0–28.0)
Bicarbonate: 15 mmol/L — ABNORMAL LOW (ref 20.0–28.0)
FIO2: 0.32
FIO2: 0.5
FIO2: 0.5
LHR: 15 {breaths}/min
MECHVT: 450 mL
O2 SAT: 96.5 %
O2 SAT: 97 %
O2 Saturation: 97 %
PATIENT TEMPERATURE: 37
PCO2 ART: 41 mmHg (ref 32.0–48.0)
PEEP/CPAP: 5 cmH2O
PEEP: 5 cmH2O
PH ART: 7.17 — AB (ref 7.350–7.450)
Patient temperature: 37
Patient temperature: 37
RATE: 14 resp/min
VT: 400 mL
pCO2 arterial: 26 mmHg — ABNORMAL LOW (ref 32.0–48.0)
pCO2 arterial: 39 mmHg (ref 32.0–48.0)
pH, Arterial: 7.22 — ABNORMAL LOW (ref 7.350–7.450)
pH, Arterial: 7.26 — ABNORMAL LOW (ref 7.350–7.450)
pO2, Arterial: 104 mmHg (ref 83.0–108.0)
pO2, Arterial: 107 mmHg (ref 83.0–108.0)
pO2, Arterial: 107 mmHg (ref 83.0–108.0)

## 2016-07-29 LAB — BASIC METABOLIC PANEL
ANION GAP: 14 (ref 5–15)
Anion gap: 15 (ref 5–15)
BUN: 76 mg/dL — AB (ref 6–20)
BUN: 73 mg/dL — ABNORMAL HIGH (ref 6–20)
CALCIUM: 7 mg/dL — AB (ref 8.9–10.3)
CALCIUM: 6.9 mg/dL — AB (ref 8.9–10.3)
CO2: 12 mmol/L — ABNORMAL LOW (ref 22–32)
CO2: 18 mmol/L — AB (ref 22–32)
CREATININE: 2.82 mg/dL — AB (ref 0.44–1.00)
Chloride: 109 mmol/L (ref 101–111)
Chloride: 102 mmol/L (ref 101–111)
Creatinine, Ser: 2.95 mg/dL — ABNORMAL HIGH (ref 0.44–1.00)
GFR, EST AFRICAN AMERICAN: 18 mL/min — AB (ref >60)
GFR, EST AFRICAN AMERICAN: 17 mL/min — AB (ref >60)
GFR, EST NON AFRICAN AMERICAN: 15 mL/min — AB (ref >60)
GFR, EST NON AFRICAN AMERICAN: 14 mL/min — AB (ref >60)
Glucose, Bld: 127 mg/dL — ABNORMAL HIGH (ref 65–99)
Glucose, Bld: 284 mg/dL — ABNORMAL HIGH (ref 65–99)
POTASSIUM: 5.7 mmol/L — AB (ref 3.5–5.1)
Potassium: 6.5 mmol/L (ref 3.5–5.1)
SODIUM: 136 mmol/L (ref 135–145)
Sodium: 134 mmol/L — ABNORMAL LOW (ref 135–145)

## 2016-07-29 LAB — CBC
HEMATOCRIT: 27.8 % — AB (ref 35.0–47.0)
Hemoglobin: 9.1 g/dL — ABNORMAL LOW (ref 12.0–16.0)
MCH: 26.2 pg (ref 26.0–34.0)
MCHC: 32.5 g/dL (ref 32.0–36.0)
MCV: 80.5 fL (ref 80.0–100.0)
Platelets: 480 10*3/uL — ABNORMAL HIGH (ref 150–440)
RBC: 3.46 MIL/uL — ABNORMAL LOW (ref 3.80–5.20)
RDW: 25.1 % — AB (ref 11.5–14.5)
WBC: 29.2 10*3/uL — ABNORMAL HIGH (ref 3.6–11.0)

## 2016-07-29 LAB — HEPATIC FUNCTION PANEL
ALT: 843 U/L — ABNORMAL HIGH (ref 14–54)
AST: 2685 U/L — ABNORMAL HIGH (ref 15–41)
Albumin: 1.8 g/dL — ABNORMAL LOW (ref 3.5–5.0)
Alkaline Phosphatase: 86 U/L (ref 38–126)
BILIRUBIN DIRECT: 1.7 mg/dL — AB (ref 0.1–0.5)
BILIRUBIN INDIRECT: 1.4 mg/dL — AB (ref 0.3–0.9)
BILIRUBIN TOTAL: 3.1 mg/dL — AB (ref 0.3–1.2)
Total Protein: 4.3 g/dL — ABNORMAL LOW (ref 6.5–8.1)

## 2016-07-29 LAB — MAGNESIUM: Magnesium: 1.8 mg/dL (ref 1.7–2.4)

## 2016-07-29 LAB — PROTIME-INR
INR: 6.97 — AB
Prothrombin Time: 62.4 seconds — ABNORMAL HIGH (ref 11.4–15.2)

## 2016-07-29 LAB — LACTIC ACID, PLASMA
LACTIC ACID, VENOUS: 4.3 mmol/L — AB (ref 0.5–1.9)
LACTIC ACID, VENOUS: 6 mmol/L — AB (ref 0.5–1.9)
Lactic Acid, Venous: 5.6 mmol/L (ref 0.5–1.9)

## 2016-07-29 LAB — PHOSPHORUS: Phosphorus: 6.5 mg/dL — ABNORMAL HIGH (ref 2.5–4.6)

## 2016-07-29 LAB — PROCALCITONIN: PROCALCITONIN: 2.78 ng/mL

## 2016-07-29 LAB — PREPARE RBC (CROSSMATCH)

## 2016-07-29 MED ORDER — MIDAZOLAM HCL 2 MG/2ML IJ SOLN: 2.0000 mg | Freq: Once | INTRAMUSCULAR | Status: DC

## 2016-07-29 MED ORDER — STERILE WATER FOR INJECTION IV SOLN
INTRAVENOUS | Status: DC
  Administered 2016-07-29 (×2): via INTRAVENOUS
  Filled 2016-07-29 (×7): qty 850

## 2016-07-29 MED ORDER — MIDAZOLAM HCL 2 MG/2ML IJ SOLN
1.0000 mg | INTRAMUSCULAR | Status: DC | PRN
  Administered 2016-07-29: 1 mg via INTRAVENOUS

## 2016-07-29 MED ORDER — FENTANYL CITRATE (PF) 100 MCG/2ML IJ SOLN
25.0000 ug | INTRAMUSCULAR | Status: DC | PRN
Start: 2016-07-29 — End: 2016-07-31
  Administered 2016-07-29 (×3): 100 ug via INTRAVENOUS
  Filled 2016-07-29 (×2): qty 2

## 2016-07-29 MED ORDER — FENTANYL CITRATE (PF) 100 MCG/2ML IJ SOLN
100.0000 ug | Freq: Once | INTRAMUSCULAR | Status: AC
  Administered 2016-07-29: 100 ug via INTRAVENOUS

## 2016-07-29 MED ORDER — IPRATROPIUM-ALBUTEROL 0.5-2.5 (3) MG/3ML IN SOLN: 3.0000 mL | Freq: Four times a day (QID) | RESPIRATORY_TRACT | Status: DC | PRN

## 2016-07-29 MED ORDER — SODIUM CHLORIDE 0.9 % IV SOLN: INTRAVENOUS | Status: DC | PRN

## 2016-07-29 MED ORDER — SODIUM BICARBONATE 8.4 % IV SOLN
50.0000 meq | Freq: Once | INTRAVENOUS | Status: AC
  Administered 2016-07-29: 50 meq via INTRAVENOUS
  Filled 2016-07-29: qty 50

## 2016-07-29 MED ORDER — FENTANYL 2500MCG IN NS 250ML (10MCG/ML) PREMIX INFUSION
25.0000 ug/h | INTRAVENOUS | Status: DC
  Administered 2016-07-29: 50 ug/h via INTRAVENOUS
  Administered 2016-07-30: 75 ug/h via INTRAVENOUS
  Filled 2016-07-29 (×2): qty 250

## 2016-07-29 MED ORDER — SODIUM POLYSTYRENE SULFONATE 15 GM/60ML PO SUSP
30.0000 g | Freq: Once | ORAL | Status: AC
  Administered 2016-07-29: 30 g via ORAL
  Filled 2016-07-29: qty 120

## 2016-07-29 MED ORDER — MORPHINE SULFATE (PF) 2 MG/ML IV SOLN: 2.0000 mg | INTRAVENOUS | Status: DC | PRN

## 2016-07-29 MED ORDER — METHYLPREDNISOLONE SODIUM SUCC 40 MG IJ SOLR
40.0000 mg | Freq: Two times a day (BID) | INTRAMUSCULAR | Status: DC
  Administered 2016-07-29: 40 mg via INTRAVENOUS
  Filled 2016-07-29: qty 1

## 2016-07-29 MED ORDER — MIDAZOLAM HCL 2 MG/2ML IJ SOLN
1.0000 mg | Freq: Once | INTRAMUSCULAR | Status: AC
  Administered 2016-07-29: 1 mg via INTRAVENOUS

## 2016-07-29 MED ORDER — ACETAMINOPHEN 325 MG PO TABS: 650.0000 mg | ORAL_TABLET | Freq: Four times a day (QID) | ORAL | Status: DC | PRN

## 2016-07-29 MED ORDER — ROCURONIUM BROMIDE 50 MG/5ML IV SOLN
INTRAVENOUS | Status: AC
  Administered 2016-07-29: 50 mg via INTRAVENOUS
  Filled 2016-07-29: qty 1

## 2016-07-29 MED ORDER — MIDAZOLAM HCL 2 MG/2ML IJ SOLN
INTRAMUSCULAR | Status: AC
  Administered 2016-07-29: 1 mg via INTRAVENOUS
  Filled 2016-07-29: qty 4

## 2016-07-29 MED ORDER — FENTANYL CITRATE (PF) 100 MCG/2ML IJ SOLN
INTRAMUSCULAR | Status: AC
  Administered 2016-07-29: 100 ug via INTRAVENOUS
  Filled 2016-07-29: qty 4

## 2016-07-29 MED ORDER — FENTANYL BOLUS VIA INFUSION
25.0000 ug | INTRAVENOUS | Status: DC | PRN
  Administered 2016-07-30: 25 ug via INTRAVENOUS
  Filled 2016-07-29: qty 25

## 2016-07-29 MED ORDER — INSULIN ASPART 100 UNIT/ML ~~LOC~~ SOLN
10.0000 [IU] | Freq: Once | SUBCUTANEOUS | Status: AC
  Administered 2016-07-29: 10 [IU] via INTRAVENOUS
  Filled 2016-07-29: qty 10

## 2016-07-29 MED ORDER — SODIUM CHLORIDE 0.9% FLUSH
10.0000 mL | Freq: Two times a day (BID) | INTRAVENOUS | Status: DC
  Administered 2016-07-29 (×2): 10 mL

## 2016-07-29 MED ORDER — ACETAMINOPHEN 650 MG RE SUPP: 650.0000 mg | Freq: Four times a day (QID) | RECTAL | Status: DC | PRN

## 2016-07-29 MED ORDER — DEXTROSE 50 % IV SOLN
1.0000 | Freq: Once | INTRAVENOUS | Status: AC
  Administered 2016-07-29: 50 mL via INTRAVENOUS
  Filled 2016-07-29: qty 50

## 2016-07-29 MED ORDER — PHENYLEPHRINE HCL 10 MG/ML IJ SOLN
0.0000 ug/min | INTRAVENOUS | Status: DC
  Administered 2016-07-29: 250 ug/min via INTRAVENOUS
  Administered 2016-07-29: 50 ug/min via INTRAVENOUS
  Administered 2016-07-30: 250 ug/min via INTRAVENOUS
  Filled 2016-07-29 (×5): qty 4

## 2016-07-29 MED ORDER — PHENYLEPHRINE HCL 10 MG/ML IJ SOLN
0.0000 ug/min | INTRAVENOUS | Status: DC
  Administered 2016-07-29: 50 ug/min via INTRAVENOUS
  Filled 2016-07-29 (×2): qty 1

## 2016-07-29 MED ORDER — NOREPINEPHRINE BITARTRATE 1 MG/ML IV SOLN
0.0000 ug/min | INTRAVENOUS | Status: DC
  Administered 2016-07-29: 10 ug/min via INTRAVENOUS
  Filled 2016-07-29: qty 16

## 2016-07-29 MED ORDER — SODIUM CHLORIDE 0.9% FLUSH: 10.0000 mL | INTRAVENOUS | Status: DC | PRN

## 2016-07-29 MED ORDER — SODIUM BICARBONATE 8.4 % IV SOLN
50.0000 meq | Freq: Once | INTRAVENOUS | Status: AC
  Administered 2016-07-29: 50 meq via INTRAVENOUS

## 2016-07-29 MED ORDER — ROCURONIUM BROMIDE 50 MG/5ML IV SOLN
50.0000 mg | Freq: Once | INTRAVENOUS | Status: AC
  Administered 2016-07-29: 50 mg via INTRAVENOUS

## 2016-07-29 MED ORDER — FENTANYL CITRATE (PF) 100 MCG/2ML IJ SOLN
INTRAMUSCULAR | Status: AC
  Administered 2016-07-29: 100 ug via INTRAVENOUS
  Filled 2016-07-29: qty 2

## 2016-07-29 MED ORDER — VANCOMYCIN HCL IN DEXTROSE 750-5 MG/150ML-% IV SOLN
750.0000 mg | INTRAVENOUS | Status: DC
  Filled 2016-07-29: qty 150

## 2016-07-29 MED ORDER — FENTANYL CITRATE (PF) 100 MCG/2ML IJ SOLN: 50.0000 ug | Freq: Once | INTRAMUSCULAR | Status: DC

## 2016-07-29 MED ORDER — PHYTONADIONE 5 MG PO TABS: 5.0000 mg | ORAL_TABLET | Freq: Once | ORAL | Status: DC

## 2016-07-29 MED ORDER — MIDAZOLAM HCL 2 MG/2ML IJ SOLN
1.0000 mg | INTRAMUSCULAR | Status: DC | PRN
Start: 2016-07-29 — End: 2016-07-31
  Administered 2016-07-30 (×2): 1 mg via INTRAVENOUS
  Filled 2016-07-29 (×3): qty 2

## 2016-07-29 MED ORDER — FAMOTIDINE IN NACL 20-0.9 MG/50ML-% IV SOLN
20.0000 mg | Freq: Every day | INTRAVENOUS | Status: DC
  Administered 2016-07-29 – 2016-07-30 (×2): 20 mg via INTRAVENOUS
  Filled 2016-07-29 (×2): qty 50

## 2016-07-29 MED ORDER — SODIUM CHLORIDE 0.9 % IV SOLN
Freq: Once | INTRAVENOUS | Status: AC
  Administered 2016-07-29: 04:00:00 via INTRAVENOUS

## 2016-07-29 MED ORDER — SODIUM BICARBONATE 8.4 % IV SOLN
INTRAVENOUS | Status: AC
  Administered 2016-07-29: 50 meq via INTRAVENOUS
  Filled 2016-07-29: qty 50

## 2016-07-29 MED ORDER — CHLORHEXIDINE GLUCONATE 0.12% ORAL RINSE (MEDLINE KIT)
15.0000 mL | Freq: Two times a day (BID) | OROMUCOSAL | Status: DC
  Administered 2016-07-29 – 2016-07-30 (×4): 15 mL via OROMUCOSAL

## 2016-07-29 MED ORDER — DEXTROSE 5 % IV SOLN
500.0000 mg | Freq: Every day | INTRAVENOUS | Status: DC
  Administered 2016-07-29 – 2016-07-30 (×2): 500 mg via INTRAVENOUS
  Filled 2016-07-29 (×2): qty 0.5

## 2016-07-29 MED ORDER — ORAL CARE MOUTH RINSE
15.0000 mL | OROMUCOSAL | Status: DC
  Administered 2016-07-29 – 2016-07-30 (×16): 15 mL via OROMUCOSAL

## 2016-07-29 MED ORDER — METRONIDAZOLE IN NACL 5-0.79 MG/ML-% IV SOLN
500.0000 mg | Freq: Three times a day (TID) | INTRAVENOUS | Status: DC
  Administered 2016-07-29 – 2016-07-30 (×5): 500 mg via INTRAVENOUS
  Filled 2016-07-29 (×8): qty 100

## 2016-07-29 NOTE — Procedures (Signed)
Central Venous Catheter Insertion Procedure Note Mercer Bente [id number] 1941-08-21  Procedure: Insertion of Central Venous Catheter Indications: Assessment of intravascular volume, Drug and/or fluid administration and Frequent blood sampling  Procedure Details Consent: Risks of procedure as well as the alternatives and risks of each were explained to the (patient/caregiver).  Consent for procedure obtained. Time Out: Verified patient identification, verified procedure, site/side was marked, verified correct patient position, special equipment/implants available, medications/allergies/relevent history reviewed, required imaging and test results available.  Performed  Maximum sterile technique was used including antiseptics, cap, gloves, gown, hand hygiene, mask and sheet. Skin prep: Chlorhexidine; local anesthetic administered A antimicrobial bonded/coated triple lumen catheter was placed in the left internal jugular vein using the Seldinger technique.  Evaluation Blood flow good Complications: No apparent complications Patient did tolerate procedure well. Chest X-ray ordered to verify placement.  CXR: pending.  Left internal jugular central line placed utilizing ultrasound no complications during or after procedure.  Awilda Bill 07/29/2016, 1:15 AM  Merton Border, MD PCCM service Mobile [phone] Pager [phone] 07/29/2016

## 2016-07-29 NOTE — Progress Notes (Signed)
Pt condition got worst overnight, now intubated, I d/w Dr. Alva Garnet she will be transferred to intensivist service.

## 2016-07-29 NOTE — Progress Notes (Signed)
O2 sat on 50% is 100%, decreased to 40% and will continue to monitor.

## 2016-07-29 NOTE — Progress Notes (Signed)
Patient's BP continues to drop despite 2L NS boluses.  Discussed pt's BP with Hinton Dyer, NP.  Hinton Dyer, NP at bedside to place central line for pressors.  Neosynephrine started through peripheral IV for BP support.  Consent obtained from patient's son over the phone.  Orders placed for repeat CBC and ABG.  Will continue to monitor.

## 2016-07-29 NOTE — Consult Note (Signed)
Patient ID: Joyce Horton, female   DOB: 1941-06-15, 75 y.o.   MRN: [id number]  CC: Sepsis  HPI Joyce Horton is a 75 y.o. female who is currently admitted to the medicine service in the ICU. He was admitted under the presumption diagnosis of a urinary tract infection caused sepsis. The time of my consultation patient is intubated, sedated and requiring multiple medications to maintain blood pressure. She was noted to be in A. fib with RVR and transferred to the ICU and was intubated and sedated secondary to respiratory depression. Surgery was consuls up as results of the CT scan that was obtained yesterday which showed a distended gallbladder with possible concern for cholecystitis. All history is obtained from the chart and from the nurse practitioner at the bedside. Patient was admitted after being at home and not making much urine or being very responsive for multiple days. She is 1 week status post spine surgery and has a known fractures of the upper extremity. She was complaining of pain but secondary to dementia she's been unable to localize or assist in finding the primary cause.  HPI  Past Medical History:  Diagnosis Date  . Anemia   . Atrial fibrillation ([place])   . 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 Neoadjuvant chemotherapy. Wide local excision, sentinel node biopsy completed May 2011 followed by whole breast radiation.  . Fall 2012  . GERD (gastroesophageal reflux disease)   . History of chemotherapy   . Hypertension   . Obesity   . Osteoporosis   . Ulcer [facility])     Past Surgical History:  Procedure Laterality Date  . BREAST BIOPSY  2000,2007,2010   left breast 2010  . BREAST SURGERY Left 2011   lumpectomy  . COLONOSCOPY  2002,2007,2010   [place]  . KYPHOPLASTY N/A 07/10/2016   Procedure: KYPHOPLASTY  T 11;  Surgeon: Hessie Knows, MD;  Location: [facility] ORS;  Service: Orthopedics;  Laterality: N/A;  . SKIN LESION EXCISION   [id number]  . stents     R leg  . TIBIA FRACTURE SURGERY Right July 2013   right femur fracture with internal fixation.    Family History  Problem Relation Age of Onset  . Cancer Mother     breast  . Kidney failure Mother   . CAD Father   . Heart failure Father     Social History Social History  Substance Use Topics  . Smoking status: Never Smoker  . Smokeless tobacco: Never Used  . Alcohol use 0.0 oz/week    Allergies  Allergen Reactions  . Fentanyl Other (See Comments)    Hallucinations and mental alterations    Current Facility-Administered Medications  Medication Dose Route Frequency Provider Last Rate Last Dose  . 0.9 %  sodium chloride infusion   Intravenous Once Awilda Bill, NP      . 0.9 %  sodium chloride infusion   Intra-arterial PRN Awilda Bill, NP      . acetaminophen (TYLENOL) tablet 650 mg  650 mg Oral Q6H PRN Henreitta Leber, MD       Or  . acetaminophen (TYLENOL) suppository 650 mg  650 mg Rectal Q6H PRN Henreitta Leber, MD   650 mg at 07/28/16 1822  . amiodarone (NEXTERONE PREMIX) 360-4.14 MG/200ML-% (1.8 mg/mL) IV infusion  60 mg/hr Intravenous Continuous Isaias Cowman, MD 33.3 mL/hr at 07/29/16 0030 60 mg/hr at 07/29/16 0030  . chlorhexidine gluconate (MEDLINE KIT) (PERIDEX) 0.12 %  solution 15 mL  15 mL Mouth Rinse BID Awilda Bill, NP      . MEDLINE mouth rinse  15 mL Mouth Rinse 10 times per day Awilda Bill, NP      . methylPREDNISolone sodium succinate (SOLU-MEDROL) 40 mg/mL injection 40 mg  40 mg Intravenous Q12H Awilda Bill, NP      . norepinephrine (LEVOPHED) 16 mg in dextrose 5 % 250 mL (0.064 mg/mL) infusion  0-40 mcg/min Intravenous Titrated Awilda Bill, NP 23.4 mL/hr at 07/29/16 0400 25 mcg/min at 07/29/16 0400  . ondansetron (ZOFRAN) tablet 4 mg  4 mg Oral Q6H PRN Henreitta Leber, MD       Or  . ondansetron (ZOFRAN) injection 4 mg  4 mg Intravenous Q6H PRN Henreitta Leber, MD      . phenylephrine (NEO-SYNEPHRINE)  40 mg in dextrose 5 % 250 mL (0.16 mg/mL) infusion  0-400 mcg/min Intravenous Titrated Awilda Bill, NP 37.5 mL/hr at 07/29/16 0400 100 mcg/min at 07/29/16 0400  . piperacillin-tazobactam (ZOSYN) IVPB 3.375 g  3.375 g Intravenous Q8H Dustin Flock, MD   3.375 g at 07/28/16 1800  . sodium bicarbonate 150 mEq in sterile water 1,000 mL infusion   Intravenous Continuous Awilda Bill, NP      . sodium chloride 0.9 % bolus 1,000 mL  1,000 mL Intravenous PRN Lance Coon, MD   1,000 mL at 07/29/16 0000  . sodium chloride 0.9 % bolus 500 mL  500 mL Intravenous PRN Lance Coon, MD   500 mL at 07/28/16 2320  . sodium chloride flush (NS) 0.9 % injection 10-40 mL  10-40 mL Intracatheter Q12H Awilda Bill, NP      . sodium chloride flush (NS) 0.9 % injection 10-40 mL  10-40 mL Intracatheter PRN Awilda Bill, NP      . sodium chloride flush (NS) 0.9 % injection 3 mL  3 mL Intravenous Q12H Henreitta Leber, MD   3 mL at 07/28/16 2230  . sodium polystyrene (KAYEXALATE) 15 GM/60ML suspension 30 g  30 g Oral Once Awilda Bill, NP      . vancomycin (VANCOCIN) IVPB 750 mg/150 ml premix  750 mg Intravenous Q36H Henreitta Leber, MD   750 mg at 07/28/16 1758     Review of Systems Unable to obtain secondary dementia and intubation and sedation.  Physical Exam Blood pressure (!) 160/78, pulse (!) 111, temperature 98.7 F (37.1 C), temperature source Axillary, resp. rate 15, height 5' (1.524 m), weight 75.5 kg (166 lb 8 oz), SpO2 98 %. CONSTITUTIONAL: Laying in bed intubated and sedated. EYES: Pupils are equal, round, and reactive to light, Sclera are non-icteric. EARS, NOSE, MOUTH AND THROAT: The oropharynx is clear. The oral mucosa is pink and moist. Hearing is intact to voice. LYMPH NODES:  Lymph nodes in the neck are normal. RESPIRATORY:  Lungs are mechanically ventilated.  CARDIOVASCULAR: Heart is irregularly irregular GI: The abdomen is soft, and nondistended. There is a palpable umbilical  hernia. There is no hepatosplenomegaly. There are normal bowel sounds in all quadrants. Unable to ascertain abdominal pain secondary to current sedation GU: Rectal deferred.   MUSCULOSKELETAL: Normal muscle strength and tone. No cyanosis or edema.   SKIN: Turgor is good and there are no pathologic skin lesions or ulcers. NEUROLOGIC: Motor and sensation is grossly normal. Cranial nerves are grossly intact. PSYCH:  Oriented to person, place and time. Affect is normal.  Data Reviewed Images and  labs reviewed, labs concerning for worsening renal function with a creatinine of 2.8, BUN of 76, potassium of 6.5. Elevated bilirubin of 2.4 the remaining LFTs are within normal limits. Patient also has a worsening lactic acidosis currently at 6.0 elevated from 2.02 days prior. Her white blood cell count is also elevated to 27.6 from 18 the day before. Noncontrasted CT scan of the abdomen does show a distended gallbladder with evidence of gallstones but no other obvious signs of cholecystitis. There is no evidence of free air or abscess on this noncontrast study however there are some mildly distended small bowel loops as well as some free fluid within the abdomen that may represent ascites. I have personally reviewed the patient's imaging, laboratory findings and medical records.    Assessment    Sepsis of unknown origin    Plan    75 year old female admitted currently with sepsis. Question of biliary source due to distended gallbladder seen on CT scan. Noncontrasted CT is not the imaging modality of choice for assessing gallbladder disease. Patient is becoming progressively more unstable with worsening renal function and a worsening lactic acidosis. At this point she would not be an operative candidate for biliary disease, if cholecystitis becomes the leading likelihood of her disease process a cholecystostomy tube would be indicated. Given her A. fib with RVR it is possible that she is showering clot to her  abdomen leading to ischemic changes which could also give her current clinical picture. She also is recently postop from spine surgery and has a fracture to the upper extremity. Overall, identifying the source of her current disease process is challenging. No plans for operative intervention at this time however surgery will continue to follow with you and attempts to assist in making the diagnosis.     Time spent with the patient was 110 minutes, with more than 50% of the time spent in face-to-face education, counseling and care coordination.     Clayburn Pert, MD FACS General Surgeon 07/29/2016, 4:31 AM

## 2016-07-29 NOTE — Progress Notes (Addendum)
eLink Physician-Brief Progress Note Patient Name: Joyce Horton DOB: Feb 15, 1941 MRN: [id number]   Date of Service  07/29/2016  HPI/Events of Note  Admit with sepsis. Elevated bilirubin with distended gall bladder on CT scan. She may have UTI as well but UA is not impressive. Needed intubation and CVL, a line placement today.  eICU Interventions  Continue fluids, pressors. Start stress dose steroids Broad antibiotics with vanco, zosyn Will need surgery to evaluate gall bladder Nephrology eval for AKI, hyperkalemia. Full note to follow.     Intervention Category Evaluation Type: New Patient Evaluation  Joyce Horton 07/29/2016, 3:33 AM

## 2016-07-29 NOTE — Progress Notes (Signed)
Advanced ETT 2 cm per NP per CXR. ETT 24 @ lips.

## 2016-07-29 NOTE — Progress Notes (Signed)
Took critical lab value.  Lactic 6.0  Hinton Dyer NP informed.

## 2016-07-29 NOTE — Procedures (Signed)
Endotracheal Intubation Procedure Note  Indication for endotracheal intubation: respiratory failure. Airway Assessment: Mallampati Class: III (soft and hard palate and base of uvula visible). Sedation: fentanyl and midazolam. Paralytic: rocuronium. Lidocaine: no. Atropine: no. Equipment: 7.56mm cuffed endotracheal tube. Cricoid Pressure: yes. Number of attempts: 1. ETT location confirmed by by auscultation, by CXR and ETCO2 monitor.  Joyce Horton 07/29/2016   Merton Border, MD PCCM service Mobile [phone] Pager [phone] 07/29/2016

## 2016-07-29 NOTE — Progress Notes (Signed)
Spoke with Moses Manners (pt son) via phone to set up time to meet with ICU MD. He stated he will come by today but could not specify time.

## 2016-07-29 NOTE — Consult Note (Signed)
[facility]  CONSULT NOTE    Date: 07/29/2016                  Patient Name:  Joyce Horton  MRN: [id number]  DOB: November 07, 1940  Age / Sex: 75 y.o., female         PCP: Idelle Crouch, MD                 Service Requesting Consult: Dr. Leonidas Romberg                 Reason for Consult: Acute Renal Failure            History of Present Illness: Joyce Horton is a 75 y.o. white female with dementia, hypertension, history of breast cancer, GERD, osteoporosis, peptic ulcer disease, peripheral vascular disease who was admitted to [facility] on 07/18/2016 for Sepsis, due to unspecified organism [facility]) [A41.9]   Patient found to have E. Coli growing in her urine culture. Placed on norepinephrine and phenylephrine for hypotension. Empirically on vanc and zosyn.  Anuric urine output. Hyperkalemia - shifted but still 5.7  Patient had kyphoplasty of her compression fracture of T11 on 10/5 by Dr. Rudene Christians. Then patient fell on 10/15 where she was found to have a closed left humeral fracture.   Unfortunately patient brought back to hospital on 10/22 with altered mental status, poor by mouth intake and found to be in atrial fibrillation. She was in a lot of pain. Not able to give much history due to dementia.   Deteriorated overnight. Transferred to ICU where started on pressors, systemic steroids. Intubated and sedated. A-line and central line placed.    Medications: Outpatient medications: Prescriptions Prior to Admission  Medication Sig Dispense Refill Last Dose  . acetaminophen (TYLENOL) 500 MG tablet Take 500 mg by mouth every 6 (six) hours as needed for moderate pain or fever.    PRN at PRN  . ALPRAZolam (XANAX) 0.25 MG tablet Take 0.25 mg by mouth at bedtime as needed for anxiety.   PRN at PRN  . apixaban (ELIQUIS) 5 MG TABS tablet Take 1 tablet (5 mg total) by mouth 2 (two) times daily. 180 tablet 0 07/26/2016 at 2200  . Calcium-Vitamin D (CALTRATE 600 PLUS-VIT D PO)  Take 1 tablet by mouth daily.    07/26/2016 at am  . cetirizine (ZYRTEC) 10 MG tablet Take 10 mg by mouth daily as needed for allergies.   PRN at PRN  . diltiazem (CARDIZEM CD) 180 MG 24 hr capsule Take 1 capsule (180 mg total) by mouth daily. 90 capsule 0 07/26/2016 at am  . donepezil (ARICEPT) 10 MG tablet Take 10 mg by mouth at bedtime.  12 07/26/2016 at pm  . FLUoxetine (PROZAC) 20 MG tablet Take 20 mg by mouth daily.   07/26/2016 at pm  . furosemide (LASIX) 40 MG tablet Take 40 mg by mouth daily.   07/26/2016 at am  . HYDROcodone-acetaminophen (NORCO/VICODIN) 5-325 MG tablet Take 1 tablet by mouth every 6 (six) hours as needed for moderate pain. 20 tablet 0 PRN at PRN  . metoprolol (LOPRESSOR) 100 MG tablet Take 1 tablet (100 mg total) by mouth 2 (two) times daily. 60 tablet 0 07/26/2016 at 2200  . Multiple Vitamins-Minerals (MULTIVITAMIN WITH MINERALS) tablet Take 1 tablet by mouth daily.   07/26/2016 at am  . naproxen sodium (ANAPROX) 220 MG tablet Take 220 mg by mouth 2 (two) times daily as needed. For pain   PRN  at PRN  . oxybutynin (DITROPAN) 5 MG tablet Take 5 mg by mouth 2 (two) times daily.   07/26/2016 at pm  . potassium chloride SA (K-DUR,KLOR-CON) 20 MEQ tablet Take 20 mEq by mouth daily.   07/26/2016 at am    Current medications: Current Facility-Administered Medications  Medication Dose Route Frequency Provider Last Rate Last Dose  . 0.9 %  sodium chloride infusion   Intra-arterial PRN Awilda Bill, NP      . acetaminophen (TYLENOL) tablet 650 mg  650 mg Oral Q6H PRN Henreitta Leber, MD       Or  . acetaminophen (TYLENOL) suppository 650 mg  650 mg Rectal Q6H PRN Henreitta Leber, MD   650 mg at 07/28/16 1822  . amiodarone (NEXTERONE PREMIX) 360-4.14 MG/200ML-% (1.8 mg/mL) IV infusion  60 mg/hr Intravenous Continuous Isaias Cowman, MD 33.3 mL/hr at 07/29/16 0736 60 mg/hr at 07/29/16 0736  . chlorhexidine gluconate (MEDLINE KIT) (PERIDEX) 0.12 % solution 15 mL  15 mL  Mouth Rinse BID Awilda Bill, NP   15 mL at 07/29/16 0800  . famotidine (PEPCID) IVPB 20 mg premix  20 mg Intravenous Daily Awilda Bill, NP   20 mg at 07/29/16 4098  . ipratropium-albuterol (DUONEB) 0.5-2.5 (3) MG/3ML nebulizer solution 3 mL  3 mL Nebulization Q6H PRN Awilda Bill, NP      . MEDLINE mouth rinse  15 mL Mouth Rinse 10 times per day Awilda Bill, NP   15 mL at 07/29/16 0600  . methylPREDNISolone sodium succinate (SOLU-MEDROL) 40 mg/mL injection 40 mg  40 mg Intravenous Q12H Awilda Bill, NP   40 mg at 07/29/16 0537  . midazolam (VERSED) injection 1 mg  1 mg Intravenous Q15 min PRN Awilda Bill, NP   1 mg at 07/29/16 0749  . midazolam (VERSED) injection 1 mg  1 mg Intravenous Q2H PRN Awilda Bill, NP      . morphine 2 MG/ML injection 2 mg  2 mg Intravenous Q4H PRN Awilda Bill, NP      . norepinephrine (LEVOPHED) 16 mg in dextrose 5 % 250 mL (0.064 mg/mL) infusion  0-40 mcg/min Intravenous Titrated Awilda Bill, NP 7.5 mL/hr at 07/29/16 0640 8 mcg/min at 07/29/16 0640  . ondansetron (ZOFRAN) tablet 4 mg  4 mg Oral Q6H PRN Henreitta Leber, MD       Or  . ondansetron (ZOFRAN) injection 4 mg  4 mg Intravenous Q6H PRN Henreitta Leber, MD      . phenylephrine (NEO-SYNEPHRINE) 40 mg in dextrose 5 % 250 mL (0.16 mg/mL) infusion  0-400 mcg/min Intravenous Titrated Awilda Bill, NP   Stopped at 07/29/16 [phone]  . piperacillin-tazobactam (ZOSYN) IVPB 3.375 g  3.375 g Intravenous Q8H Dustin Flock, MD   3.375 g at 07/29/16 0834  . sodium bicarbonate 150 mEq in sterile water 1,000 mL infusion   Intravenous Continuous Awilda Bill, NP 75 mL/hr at 07/29/16 0515    . sodium chloride 0.9 % bolus 1,000 mL  1,000 mL Intravenous PRN Lance Coon, MD   1,000 mL at 07/29/16 0000  . sodium chloride 0.9 % bolus 500 mL  500 mL Intravenous PRN Lance Coon, MD   500 mL at 07/28/16 2320  . sodium chloride flush (NS) 0.9 % injection 10-40 mL  10-40 mL Intracatheter Q12H Awilda Bill, NP      . sodium chloride flush (NS) 0.9 % injection 10-40 mL  10-40 mL Intracatheter PRN Awilda Bill, NP      . sodium chloride flush (NS) 0.9 % injection 3 mL  3 mL Intravenous Q12H Henreitta Leber, MD   3 mL at 07/28/16 2230  . vancomycin (VANCOCIN) IVPB 750 mg/150 ml premix  750 mg Intravenous Q36H Henreitta Leber, MD   750 mg at 07/28/16 1758      Allergies: Allergies  Allergen Reactions  . Fentanyl Other (See Comments)    Hallucinations and mental alterations      Past Medical History: Past Medical History:  Diagnosis Date  . Anemia   . Atrial fibrillation ([place])   . 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 Neoadjuvant chemotherapy. Wide local excision, sentinel node biopsy completed May 2011 followed by whole breast radiation.  . Fall 2012  . GERD (gastroesophageal reflux disease)   . History of chemotherapy   . Hypertension   . Obesity   . Osteoporosis   . Ulcer [facility])      Past Surgical History: Past Surgical History:  Procedure Laterality Date  . BREAST BIOPSY  2000,2007,2010   left breast 2010  . BREAST SURGERY Left 2011   lumpectomy  . COLONOSCOPY  2002,2007,2010   [place]  . KYPHOPLASTY N/A 07/10/2016   Procedure: KYPHOPLASTY  T 11;  Surgeon: Hessie Knows, MD;  Location: [facility] ORS;  Service: Orthopedics;  Laterality: N/A;  . SKIN LESION EXCISION  [id number]  . stents     R leg  . TIBIA FRACTURE SURGERY Right July 2013   right femur fracture with internal fixation.     Family History: Family History  Problem Relation Age of Onset  . Cancer Mother     breast  . Kidney failure Mother   . CAD Father   . Heart failure Father      Social History: Social History   Social History  . Marital status: Widowed    Spouse name: N/A  . Number of children: N/A  . Years of education: N/A   Occupational History  . Not on file.   Social History Main Topics  . Smoking status: Never Smoker  .  Smokeless tobacco: Never Used  . Alcohol use 0.0 oz/week  . Drug use: No  . Sexual activity: Not on file   Other Topics Concern  . Not on file   Social History Narrative  . No narrative on file     Review of Systems: Review of Systems  Unable to perform ROS: Critical illness    Vital Signs: Blood pressure (!) 134/92, pulse (!) 51, temperature 98.6 F (37 C), temperature source Axillary, resp. rate 15, height 5' (1.524 m), weight 75.5 kg (166 lb 8 oz), SpO2 100 %.  Weight trends: Filed Weights   07/28/2016 1345  Weight: 75.5 kg (166 lb 8 oz)    Physical Exam: General: Critically ill  Head: ETT OGT  Eyes: Eyes closed  Neck: Left TLC  Lungs:  Diminished, PRVC FiO2 50%  Heart: irregular  Abdomen:  Soft, nontender, obese  Extremities: 2+ peripheral edema.  Neurologic: Intubated, sedated, on mechanical ventilation  Skin: No lesions  Access: none     Lab results: Basic Metabolic Panel:  Recent Labs Lab 07/28/16 0410 07/29/16 0028 07/29/16 0523  NA 135 136 134*  K 4.7 6.5* 5.7*  CL 105 109 102  CO2 21* 12* 18*  GLUCOSE 116* 127* 284*  BUN 67* 76* 73*  CREATININE 2.11* 2.82* 2.95*  CALCIUM 8.1* 7.0* 6.9*  MG  --   --  1.8  PHOS  --   --  6.5*    Liver Function Tests:  Recent Labs Lab 07/20/2016 1406  AST 38  ALT 14  ALKPHOS 107  BILITOT 2.4*  PROT 5.7*  ALBUMIN 2.4*   No results for input(s): LIPASE, AMYLASE in the last 168 hours. No results for input(s): AMMONIA in the last 168 hours.  CBC:  Recent Labs Lab 08/02/2016 1406  07/28/16 0410 07/29/16 0028 07/29/16 0523  WBC 16.9*  --  18.0* 27.6* 29.2*  NEUTROABS 14.9*  --   --  25.6*  --   HGB 7.0*  < > 7.9* 7.5* 9.1*  HCT 22.3*  --  24.4* 25.3* 27.8*  MCV 75.4*  --  75.2* 79.9* 80.5  PLT 622*  --  531* 483* 480*  < > = values in this interval not displayed.  Cardiac Enzymes:  Recent Labs Lab 07/29/2016 1406  TROPONINI <0.03    BNP: Invalid input(s): POCBNP  CBG:  Recent  Labs Lab 08/05/2016 1956  GLUCAP 129*    Microbiology: Results for orders placed or performed during the hospital encounter of 07/13/2016  Culture, blood (routine x 2)     Status: None (Preliminary result)   Collection Time: 07/22/2016  2:04 PM  Result Value Ref Range Status   Specimen Description BLOOD LEFT HAND  Final   Special Requests BOTTLES DRAWN AEROBIC AND ANAEROBIC 1CC  Final   Culture NO GROWTH 2 DAYS  Final   Report Status PENDING  Incomplete  Culture, blood (routine x 2)     Status: None (Preliminary result)   Collection Time: 07/13/2016  2:06 PM  Result Value Ref Range Status   Specimen Description BLOOD RIGHT ASSIST CONTROL  Final   Special Requests BOTTLES DRAWN AEROBIC AND ANAEROBIC 5CC  Final   Culture NO GROWTH 2 DAYS  Final   Report Status PENDING  Incomplete  Urine culture     Status: Abnormal   Collection Time: 07/21/2016  2:47 PM  Result Value Ref Range Status   Specimen Description URINE, CLEAN CATCH  Final   Special Requests NONE  Final   Culture >=100,000 COLONIES/mL ESCHERICHIA COLI (A)  Final   Report Status 07/29/2016 FINAL  Final   Organism ID, Bacteria ESCHERICHIA COLI (A)  Final      Susceptibility   Escherichia coli - MIC*    AMPICILLIN <=2 SENSITIVE Sensitive     CEFAZOLIN <=4 SENSITIVE Sensitive     CEFTRIAXONE <=1 SENSITIVE Sensitive     CIPROFLOXACIN 0.5 SENSITIVE Sensitive     GENTAMICIN <=1 SENSITIVE Sensitive     IMIPENEM 0.5 SENSITIVE Sensitive     NITROFURANTOIN <=16 SENSITIVE Sensitive     TRIMETH/SULFA <=20 SENSITIVE Sensitive     AMPICILLIN/SULBACTAM <=2 SENSITIVE Sensitive     PIP/TAZO <=4 SENSITIVE Sensitive     Extended ESBL NEGATIVE Sensitive     * >=100,000 COLONIES/mL ESCHERICHIA COLI  MRSA PCR Screening     Status: None   Collection Time: 07/09/2016  7:00 PM  Result Value Ref Range Status   MRSA by PCR NEGATIVE NEGATIVE Final    Comment:        The GeneXpert MRSA Assay (FDA approved for NASAL specimens only), is one component  of a comprehensive MRSA colonization surveillance program. It is not intended to diagnose MRSA infection nor to guide or monitor treatment for MRSA infections.     Coagulation Studies: No results  for input(s): LABPROT, INR in the last 72 hours.  Urinalysis:  Recent Labs  08/03/2016 1447  COLORURINE AMBER*  LABSPEC 1.016  PHURINE 5.0  GLUCOSEU NEGATIVE  HGBUR NEGATIVE  BILIRUBINUR NEGATIVE  KETONESUR NEGATIVE  PROTEINUR NEGATIVE  NITRITE NEGATIVE  LEUKOCYTESUR NEGATIVE      Imaging: Ct Abdomen Pelvis Wo Contrast  Result Date: 07/28/2016 CLINICAL DATA:  Altered mental status, hypoxia, sepsis of unknown etiology, acute renal failure. EXAM: CT ABDOMEN AND PELVIS WITHOUT CONTRAST TECHNIQUE: Multidetector CT imaging of the abdomen and pelvis was performed following the standard protocol without IV contrast. COMPARISON:  PET-CT 08/02/2009, kyphoplasty images from 07/10/2016 FINDINGS: Lower chest: Cardiomegaly with coronary arteriosclerosis. No pericardial effusion nor thickening. Small partially loculated right pleural effusion with adjacent atelectasis. Small left pleural effusion with compressive atelectasis. Hepatobiliary: Gallbladder is distended with gallstones along the dependent aspect. The unenhanced liver is unremarkable. No biliary dilatation. Pancreas: Atrophic without focal mass or ductal dilatation Spleen: No splenomegaly Adrenals/Urinary Tract: Normal adrenal glands bilaterally. Nonobstructing left upper pole 2 mm renal calculus. Small exophytic appearing cyst off the upper pole of the left kidney the larger which measures 17 mm. Urinary bladder is nondistended and which may account for the slightly thick-walled appearance. Stomach/Bowel: No bowel age obstruction. There are few mildly distended fluid and contrast filled small bowel loops predominantly within the lower pelvis measuring up to 2.1 cm in caliber. The appendix is not distended in appearance. Vascular/Lymphatic:  Mild aortic, right common iliac and side branch atherosclerosis. Reproductive: Calcified left-sided uterine fibroids. Other: Small amount of ascites. Anasarca. Small fat containing umbilical hernia. Musculoskeletal: Right hip fixation with femoral nail. T10 chronic moderate compression. T11 kyphoplasty. Degenerative disc disease from T10 through L2. IMPRESSION: 1. Moderate to marked distention of the gallbladder with cholelithiasis. Cholecystitis is not excluded. Surrounding small volume ascites about the liver, spleen and within the pelvis. 2. Nonobstructing left upper pole to mid ureteral calculus. Small upper pole left renal cysts. 3. Partially loculated small right pleural effusion with atelectasis. Small left effusion without definite loculations. 4. Multiple loops of mildly distended fluid filled small bowel predominantly in the pelvis. Enteritis not excluded. No bowel obstruction. 5. Cardiomegaly with coronary arteriosclerosis. Electronically Signed   By: Ashley Royalty M.D.   On: 07/28/2016 16:45   Dg Abd 1 View  Result Date: 07/29/2016 CLINICAL DATA:  NG tube placement. EXAM: ABDOMEN - 1 VIEW COMPARISON:  CT 07/28/2016. FINDINGS: NG tube noted coiled in the stomach. Distended loops of small bowel again noted. Colonic gas pattern is normal. No free air. Prior T11 vertebroplasty. IMPRESSION: 1. NG tube noted coiled stomach. 2. Distended loops of small bowel again noted . Colonic gas pattern normal. No free air. Electronically Signed   By: Marcello Moores  Register   On: 07/29/2016 07:03   Dg Chest Port 1 View  Result Date: 07/29/2016 CLINICAL DATA:  Unresponsive, intubation. EXAM: PORTABLE CHEST 1 VIEW COMPARISON:  Chest radiograph July 29, 2016 at 0106 hours. FINDINGS: Interval intubation, distal tip projects 5.1 cm above the carina. Stable position of LEFT internal jugular central venous catheter with distal tip projecting in mid superior vena cava. No pneumothorax. Cardiac silhouette is moderately  enlarged unchanged. Similarly widened mediastinum. Small pleural effusions. Apical pleural thickening. Soft tissue planes included osseous structures are unchanged. Old LEFT rib fractures. Old T11 compression fracture, status post cement augmentation. Old T10 compression fracture. IMPRESSION: Endotracheal tube tip projects 5.1 cm above the carina. Stable LEFT IJ line. Stable cardiomegaly, widened mediastinum attributed to vascular  structures. Small pleural effusions. Electronically Signed   By: Elon Alas M.D.   On: 07/29/2016 03:34   Dg Chest Port 1 View  Result Date: 07/29/2016 CLINICAL DATA:  75 year old female with central line placement. EXAM: PORTABLE CHEST 1 VIEW COMPARISON:  Chest radiograph dated 08/02/2016 FINDINGS: There has been interval placement of a left IJ central line with tip over central SVC. No pneumothorax. There is no focal consolidation. Blunted appearance of the costophrenic angles may be related to atelectatic changes or trace pleural effusion. There is moderate to severe cardiomegaly. There is widened appearance of the mediastinum likely related to aortic tortuosity and slightly rotated positioning of the patient. A mediastinal mass or vascular injury is not entirely excluded. CT with contrast is recommended if there is high clinical concern for mediastinal pathology. Osteopenia with thoracic scoliosis and lower thoracic vertebroplasty. Sclerotic changes of the left glenoid and humeral head. No definite acute fracture. IMPRESSION: Left IJ central line with tip over central SVC.  No pneumothorax. Widened appearance of the mediastinum relatively similar to prior radiographs, likely related to cardiomegaly and tortuosity of the aorta. Clinical correlation is recommended. No focal consolidation. Electronically Signed   By: Anner Crete M.D.   On: 07/29/2016 03:15   Dg Chest Port 1 View  Result Date: 08/02/2016 CLINICAL DATA:  Ems from home , unresponsive, cool /clammy ,  family at scene, pt received no meds today , EMS administered 1 mg Narcan pt became responsive, AMS , moaning/ groaning " help, help " , , pt with DX of left shoulder fx x1 week ago. EXAM: PORTABLE CHEST 1 VIEW COMPARISON:  12/24/2015 FINDINGS: The heart is enlarged. Aorta is tortuous. There is mild pulmonary vascular congestion but no overt edema. There are no focal consolidations. Fracture of the proximal left humerus. Chronic changes are identified in both shoulders. IMPRESSION: Cardiomegaly. Electronically Signed   By: Nolon Nations M.D.   On: 07/17/2016 16:11      Assessment & Plan: Joyce Horton is a 75 y.o. white female with dementia, hypertension, history of breast cancer, GERD, osteoporosis, peptic ulcer disease, peripheral vascular disease who was admitted to [facility] on 08/02/2016 for Sepsis  1. Acute Renal Failure with hyperkalemia, metabolic acidosis and volume overload: anuric urine output. Hemodynamically unstable requiring vasopressors and shock dose steroids.  Baseline creatinine of 0.58 - continue bicarb gtt - low threshold for CRRT. Will discuss with family when they get here.   2. Sepsis: urinary tract infection: E. Coli - empirically on vancomycin and zosyn. Febrile overnight, Tmax 101.4 Leukocytosis: wbc 29.2   3. Anemia with renal failure: status post 1 unit PRBC   4. Acute respiratory failure: requiring mechanical ventilation  5. Atrial Fibrillation:  On amiodarone  LOS: [place], [place] 10/24/20179:20 AM

## 2016-07-29 NOTE — Progress Notes (Signed)
Update: INR = 6.97 Currently to high to place vascath, CRRT is pending, but not emergent at this time.  Nephrology informed, will discuss with family.  Plan - hold off on vascath at this time - recheck INR in the AM.    Vilinda Boehringer, MD [facility] Pager [phone] (please enter 7-digits) On Call Pager - [phone] (please enter 7-digits)

## 2016-07-29 NOTE — Consult Note (Signed)
PULMONARY / CRITICAL CARE MEDICINE   Name: Joyce Horton MRN: [id number] DOB: 11/18/1940    ADMISSION DATE:  07/11/2016 CONSULTATION DATE:  07/29/2016  REFERRING MD:  Dr. Jannifer Franklin  CHIEF COMPLAINT:  Altered Mental Status  PT PROFILE: 89 F with mild dementia, macular degeneration, CAF who usually lives semi-independently underwent T11 kyphoplasty 10/05, then suffered fall at home with L proximal humeral fracture 10/15. Admitted 10/22 to Hospitalist service with AMS initially thought to be due to opiate analgesic medications. Developed progressive agitation, confusion, respiratory distress, hypotension, oliguric AKI requiring intubation early AM 10/24 with PCCM consultation and assumption of care.   MAJOR EVENTS/TEST RESULTS: 10/05 T11 kyphoplast 10/15 Fall, L humeral fracture 10/15 CT head: No acute findings 10/20 CT L shoulder: Markedly displaced 2-part surgical neck fracture, with the humeral shaft displaced about 3.3 cm anterior to the humeral head 10/22 Admitted to Hospitalist Service with [facility] 10/22 transfused one unit PRBCs for Hgb 7.0 10/23 AFRVR, amiodarone initiated. Cardiology consultation 10/23 CTAP: Moderate to marked distention of the gallbladder with cholelithiasis. Cholecystitis is not excluded. Multiple loops of mildly distended fluid filled small bowel predominantly in the pelvis. Enteritis not excluded 10/24 early AM developed respiratory distress, intubated. Vasopressors for hypotension 10/24 Renal consultation for oliguric AKI, hyperkalemia, metabolic acidosis [id number] Goals of care with pt's son who acknowledges poor prognosis but wishes for time limited trial of CRRT. Pt made DNR in event of cardiac arrest 10/24 RUQ [place]: Distended gallbladder. Gallstone measuring up to 8 mm. Pericholecystic fluid. Mild gallbladder wall thickening. Patient was not tender over this region during scanning. Cholecystitis cannot be excluded   INDWELLING DEVICES:: ETT 10/24 >>  R femoral  A-line 10/24 >>  L IJ CVL 10/24 >>   MICRO DATA: MRSA PCR 10/22 >> NEG Urine 10/22 >> E coli (pansens) Blood 10/22 >>   ANTIMICROBIALS:  Anti-infectives    Start     Dose/Rate Route Frequency Ordered Stop    1758  vancomycin (VANCOCIN) IVPB 750 mg/150 ml premix     750 mg 150 mL/hr over 60 Minutes Intravenous Every 48 hours 07/29/16 1057     07/29/16 1200  cefTAZidime (FORTAZ) 500 mg in dextrose 5 % 50 mL IVPB     500 mg 100 mL/hr over 30 Minutes Intravenous Daily 07/29/16 1050     07/29/16 1000  metroNIDAZOLE (FLAGYL) IVPB 500 mg     500 mg 100 mL/hr over 60 Minutes Intravenous Every 8 hours 07/29/16 0941     07/28/16 1700  vancomycin (VANCOCIN) IVPB 750 mg/150 ml premix  Status:  Discontinued     750 mg 150 mL/hr over 60 Minutes Intravenous Every 36 hours  2012 07/29/16 1057   07/28/16 0930  piperacillin-tazobactam (ZOSYN) IVPB 3.375 g  Status:  Discontinued     3.375 g 12.5 mL/hr over 240 Minutes Intravenous Every 8 hours 07/28/16 0900 07/29/16 0941   07/19/2016 1545  vancomycin (VANCOCIN) IVPB 1000 mg/200 mL premix     1,000 mg 200 mL/hr over 60 Minutes Intravenous  Once 07/20/2016 1539 08/01/2016 1703    1545  piperacillin-tazobactam (ZOSYN) IVPB 3.375 g     3.375 g 100 mL/hr over 30 Minutes Intravenous  Once  1539 07/20/2016 1629   07/08/2016 0400  piperacillin-tazobactam (ZOSYN) IVPB 3.375 g  Status:  Discontinued     3.375 g 12.5 mL/hr over 240 Minutes Intravenous Every 12 hours 07/07/2016 2010 07/28/16 0900     Pip-tazo 10/22 >> 10/24 Vanc 10/22 >>  Ceftaz 10/24 >>  Metronidazole 10/24 >>    HISTORY OF PRESENT ILLNESS:   This is a 75 yo female with a PMH Hypertension, GERD, Atrial Fibrillation, Anemia, Obesity, Osteoporosis,  Cancer (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 She presented to [facility] ER on 08/03/2016 with a low grade fever of 100.5, poor po intake, worsening altered mental status, and an inability to void over the past  few days.  She recently had a fall that resulted in a left humeral fracture on 10/15 she currently has a shoulder immobilizer in place and she had a compression fracture of T 11 requiring a Kyphoplasty on 07/10/16.  Per ER notes upon her arrival to the ER she was in acute renal failure with hyperkalemia and had an elevated lactic acid, and was hypoxic with O2 sats 60% on RA.  PCCM consulted 10/24 for additional management of septic shock requiring vasopressors secondary to questionable cholecystitis vs UTI requiring mechanical ventilation due to acute respiratory failure likely secondary to sedating medications and metabolic acidosis.  PAST MEDICAL HISTORY :  She  has a past medical history of Anemia; Atrial fibrillation ([employer]); Cancer [facility]) (October 2010); Fall (2012); GERD (gastroesophageal reflux disease); History of chemotherapy; Hypertension; Obesity; Osteoporosis; and Ulcer ([place]).  PAST SURGICAL HISTORY: She  has a past surgical history that includes Colonoscopy (2002,2007,2010); Breast biopsy (2000,2007,2010); Breast surgery (Left, 2011); Skin lesion excision [id number]); stents; Tibia fracture surgery (Right, July 2013); and Kyphoplasty (N/A, 07/10/2016).  Allergies  Allergen Reactions  . Fentanyl Other (See Comments)    Hallucinations and mental alterations    No current facility-administered medications on file prior to encounter.    Current Outpatient Prescriptions on File Prior to Encounter  Medication Sig  . acetaminophen (TYLENOL) 500 MG tablet Take 500 mg by mouth every 6 (six) hours as needed for moderate pain or fever.   . ALPRAZolam (XANAX) 0.25 MG tablet Take 0.25 mg by mouth at bedtime as needed for anxiety.  Marland Kitchen apixaban (ELIQUIS) 5 MG TABS tablet Take 1 tablet (5 mg total) by mouth 2 (two) times daily.  . Calcium-Vitamin D (CALTRATE 600 PLUS-VIT D PO) Take 1 tablet by mouth daily.   Marland Kitchen diltiazem (CARDIZEM CD) 180 MG 24 hr capsule Take 1 capsule (180 mg total) by mouth daily.  Marland Kitchen  donepezil (ARICEPT) 10 MG tablet Take 10 mg by mouth at bedtime.  Marland Kitchen FLUoxetine (PROZAC) 20 MG tablet Take 20 mg by mouth daily.  Marland Kitchen HYDROcodone-acetaminophen (NORCO/VICODIN) 5-325 MG tablet Take 1 tablet by mouth every 6 (six) hours as needed for moderate pain.  . metoprolol (LOPRESSOR) 100 MG tablet Take 1 tablet (100 mg total) by mouth 2 (two) times daily.  . Multiple Vitamins-Minerals (MULTIVITAMIN WITH MINERALS) tablet Take 1 tablet by mouth daily.  . naproxen sodium (ANAPROX) 220 MG tablet Take 220 mg by mouth 2 (two) times daily as needed. For pain  . oxybutynin (DITROPAN) 5 MG tablet Take 5 mg by mouth 2 (two) times daily.    FAMILY HISTORY:  Her indicated that her mother is deceased. She indicated that her father is deceased.    SOCIAL HISTORY: She  reports that she has never smoked. She has never used smokeless tobacco. She reports that she drinks alcohol. She reports that she does not use drugs.  REVIEW OF SYSTEMS:   Unable to assess pt minimally responsive  SUBJECTIVE:  Unable to assess pt minimally responsive  VITAL SIGNS: BP (!) 83/24 (BP Location: Right Leg)   Pulse 84   Temp 98.9 F (  37.2 C) (Axillary)   Resp 16   Ht 5' (1.524 m)   Wt 166 lb 8 oz (75.5 kg)   SpO2 95%   BMI 32.52 kg/m   HEMODYNAMICS:    VENTILATOR SETTINGS:    INTAKE / OUTPUT: I/O last 3 completed shifts: In: 2974.3 [I.V.:2354.3; Blood:320; IV [id number] Out: -   PHYSICAL EXAMINATION: General:  Acutely ill appearing Caucasian female Neuro:  Minimally responsive to painful stimulation, PERRL HEENT:   Supple, no JVD Cardiovascular:  Irregular, irregular, no M/R/G Lungs:  Diminished throughout, tachypneic, labored Abdomen:  Faint BS x4, taut, slightly distended Musculoskeletal:  Left shoulder immobilizer in place Skin:  Ecchymotic left upper extremity  LABS:  BMET  Recent Labs Lab 07/26/2016 1406 07/28/16 0410  NA 133* 135  K 5.3* 4.7  CL 99* 105  CO2 21* 21*  BUN 67* 67*   CREATININE 2.63* 2.11*  GLUCOSE 143* 116*    Electrolytes  Recent Labs Lab 08/05/2016 1406 07/28/16 0410  CALCIUM 9.3 8.1*    CBC  Recent Labs Lab 07/22/2016 1406 07/28/16 0012 07/28/16 0410 07/29/16 0028  WBC 16.9*  --  18.0* 27.6*  HGB 7.0* 8.0* 7.9* 7.5*  HCT 22.3*  --  24.4* 25.3*  PLT 622*  --  531* 483*    Coag's No results for input(s): APTT, INR in the last 168 hours.  Sepsis Markers  Recent Labs Lab 07/08/2016 1404 07/21/2016 1747 07/29/16 0028  LATICACIDVEN 4.1* 2.0* 6.0*  PROCALCITON  --   --  2.78    ABG  Recent Labs Lab 07/29/16 0030  PHART 7.26*  PCO2ART 26*  PO2ART 104    Liver Enzymes  Recent Labs Lab 07/07/2016 1406  AST 38  ALT 14  ALKPHOS 107  BILITOT 2.4*  ALBUMIN 2.4*    Cardiac Enzymes  Recent Labs Lab 07/20/2016 1406  TROPONINI <0.03    Glucose  Recent Labs Lab 07/09/2016 1956  GLUCAP 129*    CXR: NACPD   ASSESSMENT / PLAN:  PULMONARY A: Acute respiratory failure due to AMS, acidosis P:   Cont full vent support - settings reviewed and/or adjusted Cont vent bundle Daily SBT if/when meets criteria  CARDIOVASCULAR A:  Septic shock CAF Hx: Hypertension P:  Hold outpatient eliquis, lasix, cardizem, and metoprolol  Cardiology consulted appreciate input Amiodarone gtt per cardiology Levophed and neosynephrine gtt prn to maintain map >65 IV stress dose steroids   RENAL A:   Acute renal failure Oliguria Hyperkalemia Metabolic acidosis  P:   Sodium bicarb gtt Nephrology consulted appreciate input Kayexalate, iv insulin, and 1 amp of D50 for hyperkalemia Repeat BMP 10/24 Replace electrolytes as indicated Strict intake and output Foley catheter  GASTROINTESTINAL A:   Questionable cholecystitis  Questionable abdominal ischemic changes secondary to atrial fibrillation Hx: GERD  P:   Pepcid for PUD prophylaxis NPO for now  Surgery consulted appreciate input Per surgery may need placement of  cholecystostomy tube by IR due to cholecystitis, she is currently a poor operative candidate for biliary disease due to unstable condition at this time  HEMATOLOGIC A:   Anemia without overt blood loss H/O breast cancer (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) P:  Transfuse 1 unit pRBC's Repeat hgb 1 hour post transfusion Avoid chemical anticoagulation for now due to anemia SCD's for VTE prophylaxis Monitor for s/sx of bleeding  INFECTIOUS A:   Severe sepsis - suspect UTI vs cholecystitis P:   Trend WBC's and monitor fever curve Trend PCT's and lactic acid  Continue abx as listed above Follow cultures  ENDOCRINE A:   No acute issues P:   Monitor serum glucose Hyper/Hypoglycemic protocol  NEUROLOGIC A: Acute encephalopathy secondary to septic shock and metabolic acidosis Acute pain P:   RASS goal: 0 to -1 Prn versed to maintain RASS goal Prn morphine for pain WUA daily Lights on during the day Promote family presence at bedside     FAMILY  Goals of care discussion btw Dr Alva Garnet and son, Joyce Horton. He understands the critical nature of his mother's illness and the poor prognosis for favorable recovery. Nonetheless, noting her fairly good QOL prior to her recent medical problems, he wishes to pursue a time limited trial of aggressive support to include a trial of CRRT. HD catheter will be placed. Discussed with Dr Juleen China  Pt initially see by Marda Stalker, [place]. I subsequently spent 45 mins CCM time including time spent discussing goals of care with pt's son and including time spent discussing care plan on multidisciplinary rounds  Merton Border, MD PCCM service Mobile [phone] Pager [phone] 07/29/2016

## 2016-07-29 NOTE — Progress Notes (Signed)
Pharmacy Antibiotic Note  Joyce Horton is a 75 y.o. female admitted on 07/13/2016 with sepsis.  Pharmacy has been consulted for vancomycin and piperacillin/tazobactam dosing. Antibioitcs changed to vancomycin, ceftazidime, and metronidazole due to unknown source but possibly abdominal and renal insufficiency.   Plan: Will decrease vancomycin dosing to 750 mg iv q 48 hours for goal trough of 15-20. Will continue to monitor renal function daily. Will order trough as clinically indicated.    Ceftazidime 500 mg iv q 24 hours.    Height: 5' (152.4 cm) Weight: 166 lb 8 oz (75.5 kg) IBW/kg (Calculated) : 45.5  Temp (24hrs), Avg:99.5 F (37.5 C), Min:98 F (36.7 C), Max:101.4 F (38.6 C)   Recent Labs Lab 07/17/2016 1404 07/14/2016 1406 07/23/2016 1747 07/28/16 0410 07/29/16 0028 07/29/16 0523 07/29/16 0805  WBC  --  16.9*  --  18.0* 27.6* 29.2*  --   CREATININE  --  2.63*  --  2.11* 2.82* 2.95*  --   LATICACIDVEN 4.1*  --  2.0*  --  6.0* 5.6* 4.3*    Estimated Creatinine Clearance: 14.5 mL/min (by C-G formula based on SCr of 2.95 mg/dL (H)).    Allergies  Allergen Reactions  . Fentanyl Other (See Comments)    Hallucinations and mental alterations    Antimicrobials this admission: vancomycin 10/22 >>  Piperacillin-tazobactam  10/22 >> 10/24 Ceftazidime 10/24 >> Metronidazole 10/24 >>  Dose adjustments this admission: Vancomycin decreased to 750 mg q24h 10/24  Microbiology results: 10/22 BCx: NGTD 10/22 UCx: E coli  10/22 MRSA PCR: negative    Pharmacy will continue to monitor and adjust per consult.   Ulice Dash D 07/29/2016 10:52 AM

## 2016-07-29 NOTE — Progress Notes (Signed)
07/29/2016  Subjective: This 75 year old female currently in the ICU under the medical team with sepsis. Surgery was consulted overnight as CT scan that was obtained yesterday has showed a distended gallbladder with possible concern for cholecystitis. At that point her condition deteriorated significantly and required to be intubated and had a central line and arterial line placed. She had been initially on 2 pressors but this morning she is down to 1 pressor which is also being weaned down. She remains intubated. She also is on an amiodarone drip due to atrial fibrillation with RVR. She has been significantly acidotic as well as in renal failure and is also on a bicarbonate drip.  Vital signs: Temp:  [98 F (36.7 C)-101.4 F (38.6 C)] 98.6 F (37 C) (10/24 0747) Pulse Rate:  [27-136] 98 (10/24 1200) Resp:  [13-30] 13 (10/24 1200) BP: (66-160)/(21-92) 111/54 (10/24 1200) SpO2:  [91 %-100 %] 100 % (10/24 1200) Arterial Line BP: (15-131)/(12-75) 92/58 (10/24 1200) FiO2 (%):  [50 %] 50 % (10/24 1135)   Intake/Output: 10/23 0701 - 10/24 0700 In: 3460.8 [I.V.:2160.8; IV Piggyback:1300] Out: 75 [Urine:75] Last BM Date:  (PTA)  Physical Exam: Constitutional: Intubated, grimacing to pain. Cardiac:  Irregular rhythm and rate Pulm: Lungs are clear bilaterally but patient is intubated Abdomen: Soft, nondistended. On palpation it is unclear if the patient has any significant pain although there is some grimacing with palpation of the lower abdomen. GU:  Foley catheter in place  Labs:   Recent Labs  07/29/16 0028 07/29/16 0523  WBC 27.6* 29.2*  HGB 7.5* 9.1*  HCT 25.3* 27.8*  PLT 483* 480*    Recent Labs  07/29/16 0028 07/29/16 0523  NA 136 134*  K 6.5* 5.7*  CL 109 102  CO2 12* 18*  GLUCOSE 127* 284*  BUN 76* 73*  CREATININE 2.82* 2.95*  CALCIUM 7.0* 6.9*   No results for input(s): LABPROT, INR in the last 72 hours.  Imaging: Ct Abdomen Pelvis Wo Contrast  Result Date:  07/28/2016 CLINICAL DATA:  Altered mental status, hypoxia, sepsis of unknown etiology, acute renal failure. EXAM: CT ABDOMEN AND PELVIS WITHOUT CONTRAST TECHNIQUE: Multidetector CT imaging of the abdomen and pelvis was performed following the standard protocol without IV contrast. COMPARISON:  PET-CT 08/02/2009, kyphoplasty images from 07/10/2016 FINDINGS: Lower chest: Cardiomegaly with coronary arteriosclerosis. No pericardial effusion nor thickening. Small partially loculated right pleural effusion with adjacent atelectasis. Small left pleural effusion with compressive atelectasis. Hepatobiliary: Gallbladder is distended with gallstones along the dependent aspect. The unenhanced liver is unremarkable. No biliary dilatation. Pancreas: Atrophic without focal mass or ductal dilatation Spleen: No splenomegaly Adrenals/Urinary Tract: Normal adrenal glands bilaterally. Nonobstructing left upper pole 2 mm renal calculus. Small exophytic appearing cyst off the upper pole of the left kidney the larger which measures 17 mm. Urinary bladder is nondistended and which may account for the slightly thick-walled appearance. Stomach/Bowel: No bowel age obstruction. There are few mildly distended fluid and contrast filled small bowel loops predominantly within the lower pelvis measuring up to 2.1 cm in caliber. The appendix is not distended in appearance. Vascular/Lymphatic: Mild aortic, right common iliac and side branch atherosclerosis. Reproductive: Calcified left-sided uterine fibroids. Other: Small amount of ascites. Anasarca. Small fat containing umbilical hernia. Musculoskeletal: Right hip fixation with femoral nail. T10 chronic moderate compression. T11 kyphoplasty. Degenerative disc disease from T10 through L2. IMPRESSION: 1. Moderate to marked distention of the gallbladder with cholelithiasis. Cholecystitis is not excluded. Surrounding small volume ascites about the liver, spleen and  within the pelvis. 2. Nonobstructing  left upper pole to mid ureteral calculus. Small upper pole left renal cysts. 3. Partially loculated small right pleural effusion with atelectasis. Small left effusion without definite loculations. 4. Multiple loops of mildly distended fluid filled small bowel predominantly in the pelvis. Enteritis not excluded. No bowel obstruction. 5. Cardiomegaly with coronary arteriosclerosis. Electronically Signed   By: Ashley Royalty M.D.   On: 07/28/2016 16:45   Dg Abd 1 View  Result Date: 07/29/2016 CLINICAL DATA:  NG tube placement. EXAM: ABDOMEN - 1 VIEW COMPARISON:  CT 07/28/2016. FINDINGS: NG tube noted coiled in the stomach. Distended loops of small bowel again noted. Colonic gas pattern is normal. No free air. Prior T11 vertebroplasty. IMPRESSION: 1. NG tube noted coiled stomach. 2. Distended loops of small bowel again noted . Colonic gas pattern normal. No free air. Electronically Signed   By: Marcello Moores  Register   On: 07/29/2016 07:03   Dg Chest Port 1 View  Result Date: 07/29/2016 CLINICAL DATA:  Unresponsive, intubation. EXAM: PORTABLE CHEST 1 VIEW COMPARISON:  Chest radiograph July 29, 2016 at 0106 hours. FINDINGS: Interval intubation, distal tip projects 5.1 cm above the carina. Stable position of LEFT internal jugular central venous catheter with distal tip projecting in mid superior vena cava. No pneumothorax. Cardiac silhouette is moderately enlarged unchanged. Similarly widened mediastinum. Small pleural effusions. Apical pleural thickening. Soft tissue planes included osseous structures are unchanged. Old LEFT rib fractures. Old T11 compression fracture, status post cement augmentation. Old T10 compression fracture. IMPRESSION: Endotracheal tube tip projects 5.1 cm above the carina. Stable LEFT IJ line. Stable cardiomegaly, widened mediastinum attributed to vascular structures. Small pleural effusions. Electronically Signed   By: Elon Alas M.D.   On: 07/29/2016 03:34   Dg Chest Port 1  View  Result Date: 07/29/2016 CLINICAL DATA:  76 year old female with central line placement. EXAM: PORTABLE CHEST 1 VIEW COMPARISON:  Chest radiograph dated 07/23/2016 FINDINGS: There has been interval placement of a left IJ central line with tip over central SVC. No pneumothorax. There is no focal consolidation. Blunted appearance of the costophrenic angles may be related to atelectatic changes or trace pleural effusion. There is moderate to severe cardiomegaly. There is widened appearance of the mediastinum likely related to aortic tortuosity and slightly rotated positioning of the patient. A mediastinal mass or vascular injury is not entirely excluded. CT with contrast is recommended if there is high clinical concern for mediastinal pathology. Osteopenia with thoracic scoliosis and lower thoracic vertebroplasty. Sclerotic changes of the left glenoid and humeral head. No definite acute fracture. IMPRESSION: Left IJ central line with tip over central SVC.  No pneumothorax. Widened appearance of the mediastinum relatively similar to prior radiographs, likely related to cardiomegaly and tortuosity of the aorta. Clinical correlation is recommended. No focal consolidation. Electronically Signed   By: Anner Crete M.D.   On: 07/29/2016 03:15   US Abdomen Limited Ruq  Result Date: 07/29/2016 CLINICAL DATA:  75 year old female with abdominal pain. Abnormal CT. Subsequent encounter. EXAM: US ABDOMEN LIMITED - RIGHT UPPER QUADRANT COMPARISON:  07/28/2016 CT. FINDINGS: Gallbladder: Distended gallbladder. Gallstone measuring up to 8 mm. Pericholecystic fluid. Mild gallbladder wall thickening. Patient was not tender over this region during scanning per ultrasound technologist. Common bile duct: Diameter: 6 mm. Liver: No focal lesion identified. Within normal limits in parenchymal echogenicity. IMPRESSION: Distended gallbladder. Gallstone measuring up to 8 mm. Pericholecystic fluid. Mild gallbladder wall  thickening. Patient was not tender over this region during scanning per  ultrasound technologist. Cholecystitis cannot be excluded in proper clinical setting. There is third spacing of fluid which may contribute to this appearance. Electronically Signed   By: Genia Del M.D.   On: 07/29/2016 10:03    Assessment/Plan: This 75 year old female currently in the ICU admitted with possible urosepsis but also with no concerns for muscle cholecystitis. Noncontrast CT scan of the abdomen showed a distended gallbladder with evidence of gallstones but no other obvious signs of cholecystitis. Ultrasound done this morning shows also a distended gallbladder with mild gallbladder wall thickening as well as pericholecystic fluid but the patient already had third spacing in that area which could contribute to the appearance of this on ultrasound.  On admission the patient had an elevated total bilirubin of 2.4 with normal AST and ALT.  New set of LFTs for this morning shows a total bilirubin of 3.1 with a direct component of 1.7, AST of 2685 and ALT of 843.   --In light of these findings would recommend consultation with gastroenterology for further evaluation for possible cholangitis and choledocholithiasis as a source of her sepsis. She is currently not a surgical candidate given her current medical deterioration and condition.    Melvyn Neth, [place]

## 2016-07-29 NOTE — Progress Notes (Signed)
Inpatient Diabetes Program Recommendations  AACE/ADA: New Consensus Statement on Inpatient Glycemic Control (2015)  Target Ranges:  Prepandial:   less than 140 mg/dL      Peak postprandial:   less than 180 mg/dL (1-2 hours)      Critically ill patients:  140 - 180 mg/dL   Results for Joyce Horton, Joyce Horton (MRN [id number]) as of 07/29/2016 09:25  Ref. Range 07/28/2016 04:10 07/29/2016 00:28 07/29/2016 05:23  Glucose Latest Ref Range: 65 - 99 mg/dL 116 (H) 127 (H) 284 (H)    Admit with: Sepsis/ UTI  Current Insulin Orders: None      Note patient Intubated and started on IV Solumedrol 40 mg BID.  Glucose levels on the rise since steroids started.     MD- Please consider initiating ICU Glycemic Control Protocol     --Will follow patient during hospitalization--  Wyn Quaker RN, MSN, CDE Diabetes Coordinator Inpatient Glycemic Control Team Team Pager: [phone] (8a-5p)

## 2016-07-29 NOTE — Progress Notes (Signed)
[facility] Cardiology  SUBJECTIVE: Intubated   Vitals:   07/29/16 0900 07/29/16 1000 07/29/16 1135 07/29/16 1200  BP: (!) 111/44 (!) 120/54  (!) 111/54  Pulse: 100 99  98  Resp: 14 15  13   Temp:      TempSrc:      SpO2: 100% 100% 100% 100%  Weight:      Height:         Intake/Output Summary (Last 24 hours) at 07/29/16 1242 Last data filed at 07/29/16 1000  Gross per 24 hour  Intake          4240.09 ml  Output               90 ml  Net          4150.09 ml      PHYSICAL EXAM  General: Intubated HEENT:  Normocephalic and atramatic Neck:  No JVD.  Lungs: Clear bilaterally to auscultation and percussion. Heart: HRRR . Normal S1 and S2 without gallops or murmurs.  Abdomen: Bowel sounds are positive, abdomen soft and non-tender  Msk:  Back normal, normal gait. Normal strength and tone for age. Extremities: No clubbing, cyanosis or edema.   Neuro: Intubated Psych:  Comatose   LABS: Basic Metabolic Panel:  Recent Labs  07/29/16 0028 07/29/16 0523  NA 136 134*  K 6.5* 5.7*  CL 109 102  CO2 12* 18*  GLUCOSE 127* 284*  BUN 76* 73*  CREATININE 2.82* 2.95*  CALCIUM 7.0* 6.9*  MG  --  1.8  PHOS  --  6.5*   Liver Function Tests:  Recent Labs  07/20/2016 1406 07/29/16 0523  AST 38 2,685*  ALT 14 843*  ALKPHOS 107 86  BILITOT 2.4* 3.1*  PROT 5.7* 4.3*  ALBUMIN 2.4* 1.8*   No results for input(s): LIPASE, AMYLASE in the last 72 hours. CBC:  Recent Labs  07/29/2016 1406  07/29/16 0028 07/29/16 0523  WBC 16.9*  < > 27.6* 29.2*  NEUTROABS 14.9*  --  25.6*  --   HGB 7.0*  < > 7.5* 9.1*  HCT 22.3*  < > 25.3* 27.8*  MCV 75.4*  < > 79.9* 80.5  PLT 622*  < > 483* 480*  < > = values in this interval not displayed. Cardiac Enzymes:  Recent Labs  08/04/2016 1406  TROPONINI <0.03   BNP: Invalid input(s): POCBNP D-Dimer: No results for input(s): DDIMER in the last 72 hours. Hemoglobin A1C: No results for input(s): HGBA1C in the last 72 hours. Fasting Lipid  Panel: No results for input(s): CHOL, HDL, LDLCALC, TRIG, CHOLHDL, LDLDIRECT in the last 72 hours. Thyroid Function Tests: No results for input(s): TSH, T4TOTAL, T3FREE, THYROIDAB in the last 72 hours.  Invalid input(s): FREET3 Anemia Panel: No results for input(s): VITAMINB12, FOLATE, FERRITIN, TIBC, IRON, RETICCTPCT in the last 72 hours.  Ct Abdomen Pelvis Wo Contrast  Result Date: 07/28/2016 CLINICAL DATA:  Altered mental status, hypoxia, sepsis of unknown etiology, acute renal failure. EXAM: CT ABDOMEN AND PELVIS WITHOUT CONTRAST TECHNIQUE: Multidetector CT imaging of the abdomen and pelvis was performed following the standard protocol without IV contrast. COMPARISON:  PET-CT 08/02/2009, kyphoplasty images from 07/10/2016 FINDINGS: Lower chest: Cardiomegaly with coronary arteriosclerosis. No pericardial effusion nor thickening. Small partially loculated right pleural effusion with adjacent atelectasis. Small left pleural effusion with compressive atelectasis. Hepatobiliary: Gallbladder is distended with gallstones along the dependent aspect. The unenhanced liver is unremarkable. No biliary dilatation. Pancreas: Atrophic without focal mass or ductal dilatation Spleen: No splenomegaly  Adrenals/Urinary Tract: Normal adrenal glands bilaterally. Nonobstructing left upper pole 2 mm renal calculus. Small exophytic appearing cyst off the upper pole of the left kidney the larger which measures 17 mm. Urinary bladder is nondistended and which may account for the slightly thick-walled appearance. Stomach/Bowel: No bowel age obstruction. There are few mildly distended fluid and contrast filled small bowel loops predominantly within the lower pelvis measuring up to 2.1 cm in caliber. The appendix is not distended in appearance. Vascular/Lymphatic: Mild aortic, right common iliac and side branch atherosclerosis. Reproductive: Calcified left-sided uterine fibroids. Other: Small amount of ascites. Anasarca. Small  fat containing umbilical hernia. Musculoskeletal: Right hip fixation with femoral nail. T10 chronic moderate compression. T11 kyphoplasty. Degenerative disc disease from T10 through L2. IMPRESSION: 1. Moderate to marked distention of the gallbladder with cholelithiasis. Cholecystitis is not excluded. Surrounding small volume ascites about the liver, spleen and within the pelvis. 2. Nonobstructing left upper pole to mid ureteral calculus. Small upper pole left renal cysts. 3. Partially loculated small right pleural effusion with atelectasis. Small left effusion without definite loculations. 4. Multiple loops of mildly distended fluid filled small bowel predominantly in the pelvis. Enteritis not excluded. No bowel obstruction. 5. Cardiomegaly with coronary arteriosclerosis. Electronically Signed   By: Ashley Royalty M.D.   On: 07/28/2016 16:45   Dg Abd 1 View  Result Date: 07/29/2016 CLINICAL DATA:  NG tube placement. EXAM: ABDOMEN - 1 VIEW COMPARISON:  CT 07/28/2016. FINDINGS: NG tube noted coiled in the stomach. Distended loops of small bowel again noted. Colonic gas pattern is normal. No free air. Prior T11 vertebroplasty. IMPRESSION: 1. NG tube noted coiled stomach. 2. Distended loops of small bowel again noted . Colonic gas pattern normal. No free air. Electronically Signed   By: Marcello Moores  Register   On: 07/29/2016 07:03   Dg Chest Port 1 View  Result Date: 07/29/2016 CLINICAL DATA:  Unresponsive, intubation. EXAM: PORTABLE CHEST 1 VIEW COMPARISON:  Chest radiograph July 29, 2016 at 0106 hours. FINDINGS: Interval intubation, distal tip projects 5.1 cm above the carina. Stable position of LEFT internal jugular central venous catheter with distal tip projecting in mid superior vena cava. No pneumothorax. Cardiac silhouette is moderately enlarged unchanged. Similarly widened mediastinum. Small pleural effusions. Apical pleural thickening. Soft tissue planes included osseous structures are unchanged. Old LEFT  rib fractures. Old T11 compression fracture, status post cement augmentation. Old T10 compression fracture. IMPRESSION: Endotracheal tube tip projects 5.1 cm above the carina. Stable LEFT IJ line. Stable cardiomegaly, widened mediastinum attributed to vascular structures. Small pleural effusions. Electronically Signed   By: Elon Alas M.D.   On: 07/29/2016 03:34   Dg Chest Port 1 View  Result Date: 07/29/2016 CLINICAL DATA:  75 year old female with central line placement. EXAM: PORTABLE CHEST 1 VIEW COMPARISON:  Chest radiograph dated 07/21/2016 FINDINGS: There has been interval placement of a left IJ central line with tip over central SVC. No pneumothorax. There is no focal consolidation. Blunted appearance of the costophrenic angles may be related to atelectatic changes or trace pleural effusion. There is moderate to severe cardiomegaly. There is widened appearance of the mediastinum likely related to aortic tortuosity and slightly rotated positioning of the patient. A mediastinal mass or vascular injury is not entirely excluded. CT with contrast is recommended if there is high clinical concern for mediastinal pathology. Osteopenia with thoracic scoliosis and lower thoracic vertebroplasty. Sclerotic changes of the left glenoid and humeral head. No definite acute fracture. IMPRESSION: Left IJ central line with tip  over central SVC.  No pneumothorax. Widened appearance of the mediastinum relatively similar to prior radiographs, likely related to cardiomegaly and tortuosity of the aorta. Clinical correlation is recommended. No focal consolidation. Electronically Signed   By: Anner Crete M.D.   On: 07/29/2016 03:15   Dg Chest Port 1 View  Result Date: 07/16/2016 CLINICAL DATA:  Ems from home , unresponsive, cool /clammy , family at scene, pt received no meds today , EMS administered 1 mg Narcan pt became responsive, AMS , moaning/ groaning " help, help " , , pt with DX of left shoulder fx x1 week  ago. EXAM: PORTABLE CHEST 1 VIEW COMPARISON:  12/24/2015 FINDINGS: The heart is enlarged. Aorta is tortuous. There is mild pulmonary vascular congestion but no overt edema. There are no focal consolidations. Fracture of the proximal left humerus. Chronic changes are identified in both shoulders. IMPRESSION: Cardiomegaly. Electronically Signed   By: Nolon Nations M.D.   On: 07/18/2016 16:11   US Abdomen Limited Ruq  Result Date: 07/29/2016 CLINICAL DATA:  75 year old female with abdominal pain. Abnormal CT. Subsequent encounter. EXAM: US ABDOMEN LIMITED - RIGHT UPPER QUADRANT COMPARISON:  07/28/2016 CT. FINDINGS: Gallbladder: Distended gallbladder. Gallstone measuring up to 8 mm. Pericholecystic fluid. Mild gallbladder wall thickening. Patient was not tender over this region during scanning per ultrasound technologist. Common bile duct: Diameter: 6 mm. Liver: No focal lesion identified. Within normal limits in parenchymal echogenicity. IMPRESSION: Distended gallbladder. Gallstone measuring up to 8 mm. Pericholecystic fluid. Mild gallbladder wall thickening. Patient was not tender over this region during scanning per ultrasound technologist. Cholecystitis cannot be excluded in proper clinical setting. There is third spacing of fluid which may contribute to this appearance. Electronically Signed   By: Genia Del M.D.   On: 07/29/2016 10:03     Echo normal LV function with LVEF 65-70%, elevated RVSP consistent with pulmonary hypertension  TELEMETRY: Atrial fibrillation:  ASSESSMENT AND PLAN:  Active Problems:   Sepsis ([place])    1. Atrial fibrillation, compensatory for underlying sepsis, respiratory failure, anemia, and acute renal failure, recently controlled on amiodarone drip 2. Sepsis, with hypotension 3. Respiratory failure, secondary to sepsis 4. Acute kidney injury, likely ATN 5. Anemia, improved after transfusion  Recommendations  1. Agree with overall current therapy 2. Continue  amiodarone drip for now 3. No further cardiac diagnostics at this time   Isaias Cowman, MD, PhD, [facility] 07/29/2016 12:42 PM

## 2016-07-29 NOTE — Progress Notes (Signed)
Nutrition Follow-up  DOCUMENTATION CODES:   Not applicable  INTERVENTION:  -If unable to extubate within 24-48 hours and aggressive intervention is warranted, recommend initiation of nutrition support via TF  NUTRITION DIAGNOSIS:   Increased nutrient needs related to acute illness, wound healing as evidenced by estimated needs.  Continues  GOAL:   Patient will meet greater than or equal to 90% of their needs   MONITOR:   Diet advancement, Labs, Weight trends, I & O's  REASON FOR ASSESSMENT:   Low Braden    ASSESSMENT:   Joyce Horton  is a 75 y.o. female with a known history of Dementia, osteoporosis, hypertension, history of breast cancer, GERD, recent fall with left humeral fracture resents to the hospital due to altered mental status, poor by mouth intake, not being able to urinate over the past few days.  Pt with worsening status over night requiring intubation, pressors. ARF with hyperkalemia, metabolic acidosis and volume overload. Anuric.   Pt currently sedated on vent, OG in place with tube coiled in stomach.   CT abdomen yesterday with distended GB with concern for cholecystitis, general surgery consulted  Diet Order:   NPO  Skin:  Reviewed, no issues  Last BM:  no documented BM since admission   Labs: sodium 134, potassium 5.7, phosphorus 6.5, Creatinine 2.95  Meds: sodium bicarb at 75 ml/hr, fentanyl/versed, kayexalate this AM  Height:   Ht Readings from Last 1 Encounters:  08/03/2016 5' (1.524 m)    Weight:   Wt Readings from Last 1 Encounters:  07/17/2016 166 lb 8 oz (75.5 kg)    Ideal Body Weight:  45.45 kg  BMI:  Body mass index is 32.52 kg/m.  Estimated Nutritional Needs:   Kcal:  [id number] kcals  Protein:  >/= 152 g  Fluid:  >/= 1.5 L  EDUCATION NEEDS:   No education needs identified at this time  [place], [place], [place] [phone] Pager  [phone] Weekend/On-Call Pager

## 2016-07-29 NOTE — Procedures (Signed)
Arterial Catheter Insertion Procedure Note Joyce Horton [id number] Feb 10, 1941  Procedure: Insertion of Arterial Catheter  Indications: Blood pressure monitoring and Frequent blood sampling  Procedure Details Consent: Risks of procedure as well as the alternatives and risks of each were explained to the (patient/caregiver).  Consent for procedure obtained. Time Out: Verified patient identification, verified procedure, site/side was marked, verified correct patient position, special equipment/implants available, medications/allergies/relevent history reviewed, required imaging and test results available.  Performed  Maximum sterile technique was used including antiseptics, cap, gloves, gown, hand hygiene, mask and sheet. Skin prep: Chlorhexidine; local anesthetic administered 20 gauge catheter was inserted into right femoral artery using the Seldinger technique.  Evaluation Blood flow good; BP tracing good. Complications: No apparent complications.  Marda Stalker, [place] Pager [phone] (please enter 7 digits) PCCM Consult Pager [phone] (please enter 7 digits)  Merton Border, MD PCCM service Mobile [phone] Pager [phone] 07/29/2016

## 2016-07-30 ENCOUNTER — Inpatient Hospital Stay: Payer: Medicare Other

## 2016-07-30 DIAGNOSIS — D689 Coagulation defect, unspecified: Secondary | ICD-10-CM

## 2016-07-30 LAB — CBC
HEMATOCRIT: 29.2 % — AB (ref 35.0–47.0)
Hemoglobin: 9.2 g/dL — ABNORMAL LOW (ref 12.0–16.0)
MCH: 24.7 pg — ABNORMAL LOW (ref 26.0–34.0)
MCHC: 31.4 g/dL — ABNORMAL LOW (ref 32.0–36.0)
MCV: 78.7 fL — AB (ref 80.0–100.0)
Platelets: 377 10*3/uL (ref 150–440)
RBC: 3.71 MIL/uL — AB (ref 3.80–5.20)
RDW: 24.6 % — AB (ref 11.5–14.5)
WBC: 32.1 10*3/uL — AB (ref 3.6–11.0)

## 2016-07-30 LAB — TYPE AND SCREEN
ABO/RH(D): O NEG
ANTIBODY SCREEN: NEGATIVE
UNIT DIVISION: 0
Unit division: 0

## 2016-07-30 LAB — COMPREHENSIVE METABOLIC PANEL
ALBUMIN: 1.8 g/dL — AB (ref 3.5–5.0)
ALT: 2207 U/L — AB (ref 14–54)
AST: 2541 U/L — AB (ref 15–41)
Alkaline Phosphatase: 169 U/L — ABNORMAL HIGH (ref 38–126)
Anion gap: 13 (ref 5–15)
BILIRUBIN TOTAL: 5.2 mg/dL — AB (ref 0.3–1.2)
BUN: 82 mg/dL — AB (ref 6–20)
CHLORIDE: 98 mmol/L — AB (ref 101–111)
CO2: 23 mmol/L (ref 22–32)
CREATININE: 3.24 mg/dL — AB (ref 0.44–1.00)
Calcium: 6.7 mg/dL — ABNORMAL LOW (ref 8.9–10.3)
GFR calc Af Amer: 15 mL/min — ABNORMAL LOW (ref >60)
GFR, EST NON AFRICAN AMERICAN: 13 mL/min — AB (ref >60)
GLUCOSE: 148 mg/dL — AB (ref 65–99)
POTASSIUM: 4.7 mmol/L (ref 3.5–5.1)
Sodium: 134 mmol/L — ABNORMAL LOW (ref 135–145)
Total Protein: 4.6 g/dL — ABNORMAL LOW (ref 6.5–8.1)

## 2016-07-30 LAB — RENAL FUNCTION PANEL
ALBUMIN: 1.7 g/dL — AB (ref 3.5–5.0)
Albumin: 1.6 g/dL — ABNORMAL LOW (ref 3.5–5.0)
Anion gap: 15 (ref 5–15)
Anion gap: 16 — ABNORMAL HIGH (ref 5–15)
BUN: 80 mg/dL — AB (ref 6–20)
BUN: 66 mg/dL — AB (ref 6–20)
CALCIUM: 6.2 mg/dL — AB (ref 8.9–10.3)
CHLORIDE: 98 mmol/L — AB (ref 101–111)
CO2: 23 mmol/L (ref 22–32)
CO2: 20 mmol/L — AB (ref 22–32)
CREATININE: 3.24 mg/dL — AB (ref 0.44–1.00)
CREATININE: 2.75 mg/dL — AB (ref 0.44–1.00)
Calcium: 6.7 mg/dL — ABNORMAL LOW (ref 8.9–10.3)
Chloride: 98 mmol/L — ABNORMAL LOW (ref 101–111)
GFR calc Af Amer: 15 mL/min — ABNORMAL LOW (ref >60)
GFR calc non Af Amer: 13 mL/min — ABNORMAL LOW (ref >60)
GFR calc non Af Amer: 16 mL/min — ABNORMAL LOW (ref >60)
GFR, EST AFRICAN AMERICAN: 18 mL/min — AB (ref >60)
GLUCOSE: 131 mg/dL — AB (ref 65–99)
GLUCOSE: 148 mg/dL — AB (ref 65–99)
PHOSPHORUS: 5.4 mg/dL — AB (ref 2.5–4.6)
Phosphorus: 5.6 mg/dL — ABNORMAL HIGH (ref 2.5–4.6)
Potassium: 4.5 mmol/L (ref 3.5–5.1)
Potassium: 4.2 mmol/L (ref 3.5–5.1)
SODIUM: 136 mmol/L (ref 135–145)
Sodium: 134 mmol/L — ABNORMAL LOW (ref 135–145)

## 2016-07-30 LAB — GLUCOSE, CAPILLARY
Glucose-Capillary: 127 mg/dL — ABNORMAL HIGH (ref 65–99)
Glucose-Capillary: 130 mg/dL — ABNORMAL HIGH (ref 65–99)
Glucose-Capillary: 133 mg/dL — ABNORMAL HIGH (ref 65–99)

## 2016-07-30 LAB — PROTIME-INR
INR: 4.17
Prothrombin Time: 41.4 seconds — ABNORMAL HIGH (ref 11.4–15.2)

## 2016-07-30 LAB — PROCALCITONIN: PROCALCITONIN: 5.01 ng/mL

## 2016-07-30 MED ORDER — HEPARIN SODIUM (PORCINE) 1000 UNIT/ML DIALYSIS: 1000.0000 [IU] | INTRAMUSCULAR | Status: DC | PRN

## 2016-07-30 MED ORDER — MORPHINE SULFATE (PF) 2 MG/ML IV SOLN: 1.0000 mg | INTRAVENOUS | Status: DC | PRN

## 2016-07-30 MED ORDER — PUREFLOW DIALYSIS SOLUTION
INTRAVENOUS | Status: DC
  Administered 2016-07-30: 3 via INTRAVENOUS_CENTRAL

## 2016-07-30 MED ORDER — DEXTROSE 5 % IV SOLN
2.0000 g | Freq: Two times a day (BID) | INTRAVENOUS | Status: DC
  Administered 2016-07-30: 2 g via INTRAVENOUS
  Filled 2016-07-30 (×4): qty 2

## 2016-07-30 MED ORDER — VANCOMYCIN HCL IN DEXTROSE 1-5 GM/200ML-% IV SOLN
1000.0000 mg | INTRAVENOUS | Status: DC
  Administered 2016-07-30: 1000 mg via INTRAVENOUS
  Filled 2016-07-30: qty 200

## 2016-07-30 MED ORDER — DILTIAZEM HCL 25 MG/5ML IV SOLN
15.0000 mg | Freq: Once | INTRAVENOUS | Status: AC
  Administered 2016-07-30: 15 mg via INTRAVENOUS
  Filled 2016-07-30: qty 5

## 2016-07-30 MED ORDER — DILTIAZEM HCL 100 MG IV SOLR
5.0000 mg/h | INTRAVENOUS | Status: DC
  Administered 2016-07-30: 5 mg/h via INTRAVENOUS
  Filled 2016-07-30: qty 100

## 2016-07-30 MED ORDER — DILTIAZEM HCL 25 MG/5ML IV SOLN
INTRAVENOUS | Status: AC
  Filled 2016-07-30: qty 5

## 2016-07-30 MED ORDER — INSULIN ASPART 100 UNIT/ML ~~LOC~~ SOLN
0.0000 [IU] | SUBCUTANEOUS | Status: DC
  Administered 2016-07-30: 1 [IU] via SUBCUTANEOUS
  Filled 2016-07-30: qty 1

## 2016-07-30 MED ORDER — DILTIAZEM HCL 25 MG/5ML IV SOLN
15.0000 mg | INTRAVENOUS | Status: AC
  Administered 2016-07-30: 15 mg via INTRAVENOUS

## 2016-07-30 MED ORDER — METOPROLOL TARTRATE 5 MG/5ML IV SOLN
2.5000 mg | INTRAVENOUS | Status: DC | PRN
  Administered 2016-07-30 (×3): 2.5 mg via INTRAVENOUS
  Filled 2016-07-30 (×3): qty 5

## 2016-07-30 MED ORDER — FAMOTIDINE IN NACL 20-0.9 MG/50ML-% IV SOLN: 20.0000 mg | INTRAVENOUS | Status: DC

## 2016-07-30 MED ORDER — METOPROLOL TARTRATE 5 MG/5ML IV SOLN
5.0000 mg | Freq: Once | INTRAVENOUS | Status: AC
  Administered 2016-07-30: 5 mg via INTRAVENOUS

## 2016-07-30 MED ORDER — MORPHINE SULFATE (PF) 2 MG/ML IV SOLN
2.0000 mg | INTRAVENOUS | Status: DC | PRN
  Administered 2016-07-30: 2 mg via INTRAVENOUS
  Filled 2016-07-30: qty 1

## 2016-07-30 NOTE — Progress Notes (Signed)
Nutrition Follow-up  DOCUMENTATION CODES:   Not applicable  INTERVENTION:  -Discussed nutritional poc during ICU rounds today; MD wanting to continue to hold initiation of TF. Plan to reassess tomorrow, post GI recommendations   NUTRITION DIAGNOSIS:   Increased nutrient needs related to acute illness, wound healing as evidenced by estimated needs.  Continues  GOAL:   Provide needs based on ASPEN/SCCM guidelines  MONITOR:   Vent status, Labs, Weight trends  REASON FOR ASSESSMENT:   Low Braden    ASSESSMENT:   Pt remains critically ill, sedated on vent, neo at 60, plan to start CRRT today  OG in place, GI consult pending for elevated LFTs, concern for cholecystitis/cholangitis. Surgery evaluated and pt not a candidate for surgery  Diet Order:   NPO  Skin:  Reviewed, no issues  Last BM:  no documented BM since admission   Labs: sodium 134, [place] 3.24, elevated LFTs, corrected calcium 8.46, albumin 1.8  Meds: reviewed  Height:   Ht Readings from Last 1 Encounters:  08/03/2016 5' (1.524 m)    Weight:   Wt Readings from Last 1 Encounters:  07/18/2016 166 lb 8 oz (75.5 kg)   Filed Weights   07/23/2016 1345  Weight: 166 lb 8 oz (75.5 kg)    Ideal Body Weight:  45.45 kg  BMI:  Body mass index is 32.52 kg/m.  Estimated Nutritional Needs:   Kcal:  [id number] kcals  Protein:  >/= 152 g  Fluid:  >/= 1.5 L  EDUCATION NEEDS:   No education needs identified at this time  [place], [place], [place] [phone] Pager  [phone] Weekend/On-Call Pager

## 2016-07-30 NOTE — Progress Notes (Signed)
  Subjective: Patient remains intubated on pressors for blood pressure control. Her LFTs were more elevated yesterday and her INR was significantly elevated as well. She remains tachycardic. Given her elevated INR yesterday dialysis catheter was not placed. She remains oliguric.  Vital signs: Temp:  [97.7 F (36.5 C)-100 F (37.8 C)] 97.7 F (36.5 C) (10/25 0750) Pulse Rate:  [27-127] 88 (10/25 1230) Resp:  [11-22] 19 (10/25 1230) BP: (92-130)/(54-82) 112/66 (10/25 1230) SpO2:  [90 %-100 %] 94 % (10/25 1230) Arterial Line BP: (81-108)/(48-70) 87/54 (10/25 1230) FiO2 (%):  [40 %-50 %] 50 % (10/25 1135)   Intake/Output: 10/24 0701 - 10/25 0700 In: 4968.9 [I.V.:4328.9; Blood:340; IV Piggyback:300] Out: 94 [Urine:117] Last BM Date:  (PTA)  Physical Exam: Constitutional: Sedated, intubated Cardiac:  Irregular rhythm and rate Pulm: Clear bilaterally, no wheezes.  Intubated Abdomen: Soft, nondistended, unable to evaluate for tenderness given the patient's sedation.  Labs:   Recent Labs  07/29/16 0523  0438  WBC 29.2* 32.1*  HGB 9.1* 9.2*  HCT 27.8* 29.2*  PLT 480* 377    Recent Labs  07/29/16 0523  0438  NA 134* 134*  K 5.7* 4.7  CL 102 98*  CO2 18* 23  GLUCOSE 284* 148*  BUN 73* 82*  CREATININE 2.95* 3.24*  CALCIUM 6.9* 6.7*    Recent Labs  07/29/16 1308  0438  LABPROT 62.4* 41.4*  INR 6.97* 4.17*    Imaging: Dg Chest Port 1 View  Result Date:  CLINICAL DATA:  Respiratory failure. EXAM: PORTABLE CHEST 1 VIEW COMPARISON:  Radiograph of July 29, 2016. FINDINGS: Stable cardiomegaly. Nasogastric tube is seen entering the stomach. Left internal jugular catheter is again noted with distal tip in expected position of the SVC. Endotracheal tube is now seen with distal tip only 6 mm above the carina. Withdrawal by 2-3 cm is recommended. Atherosclerosis of thoracic aorta is noted. Hypoinflation of the lungs is noted.  Increased bilateral perihilar and basilar interstitial densities are noted concerning for edema or inflammation. No pneumothorax is noted. Possible mild bilateral pleural effusions may be present. IMPRESSION: Hypoinflation of the lungs is noted. Increased bilateral perihilar and basilar interstitial densities are noted concerning for edema or inflammation. Aortic atherosclerosis. Endotracheal tube is now seen with distal tip only 6 mm above the carina. Withdrawal by 2-3 cm is recommended. These results will be called to the ordering clinician or representative by the Radiologist Assistant, and communication documented in the PACS or zVision Dashboard. Electronically Signed   By: Marijo Conception, M.D.   On:  07:54    Assessment/Plan: 75 year old female admitted to the medical ICU for sepsis.  The patient yesterday had increasing LFTs and initially this could've potentially pointed towards choledocholithiasis given the concerns for potential cholecystitis. However with a markedly elevated INR yesterday as well as this morning and her increasing LFTs this morning as well now with an AST of 2541 and ALT of 2207, this likely represents shock liver or liver failure.  Given the patient's condition she's not a surgical candidate at this point. We'll defer to GI consultation for further input and recommendations. If there is still concern for potential gallbladder pathology, a percutaneous approach for drainage would be recommended.   Melvyn Neth, [place]

## 2016-07-30 NOTE — Progress Notes (Signed)
CRITICAL VALUE ALERT  Critical value received:  Calcium 6.2    Date of notification:    Time of notification:  1645  Critical value read back: Yes  Nurse who received alert:  Brad  MD notified (1st page):  Hinton Dyer, NP  Time of first page:  1650  MD notified (2nd page):  Time of second page:  Responding MD: Hinton Dyer, NP  Time MD responded:  [phone]

## 2016-07-30 NOTE — Progress Notes (Signed)
Pt terminally extubated per NP order. Family and RN at bedside.

## 2016-07-30 NOTE — Progress Notes (Signed)
Yevonne Aline., NP gave one-time order for Metoprolol 5mg  IV; hold 1800 hour dose. [place]

## 2016-07-30 NOTE — Progress Notes (Signed)
Pharmacy Antibiotic Note/CRRT Medication Adjustment  Joyce Horton is a 75 y.o. female admitted on  with sepsis.  Pharmacy has been consulted for vancomycin and piperacillin/tazobactam dosing. Antibioitcs changed to vancomycin, ceftazidime, and metronidazole due to unknown source but possibly abdominal and renal insufficiency.   CRRT started 10/25  Plan: 1.  Will change vancomycin to 1000 mg iv q 24 hours and check a trough with  the third dose.       Will change ceftazidime to 2 g iv q 12 hours.   2.  Will change famotidine to 20 mg iv q 48 hours.   Height: 5' (152.4 cm) Weight: 166 lb 8 oz (75.5 kg) IBW/kg (Calculated) : 45.5  Temp (24hrs), Avg:98.9 F (37.2 C), Min:97.7 F (36.5 C), Max:100 F (37.8 C)   Recent Labs Lab 07/16/2016 1404 07/26/2016 1406  1747 07/28/16 0410 07/29/16 0028 07/29/16 0523 07/29/16 0805  0438  WBC  --  16.9*  --  18.0* 27.6* 29.2*  --  32.1*  CREATININE  --  2.63*  --  2.11* 2.82* 2.95*  --  3.24*  LATICACIDVEN 4.1*  --  2.0*  --  6.0* 5.6* 4.3*  --     Estimated Creatinine Clearance: 13.2 mL/min (by C-G formula based on SCr of 3.24 mg/dL (H)).    Allergies  Allergen Reactions  . Fentanyl Other (See Comments)    Hallucinations and mental alterations    Antimicrobials this admission: vancomycin 10/22 >>  Piperacillin-tazobactam  10/22 >> 10/24 Ceftazidime 10/24 >> Metronidazole 10/24 >>  Dose adjustments this admission: Vancomycin decreased to 750 mg q24h 10/24  Microbiology results: 10/22 BCx: NGTD 10/22 UCx: E coli  10/22 MRSA PCR: negative    Pharmacy will continue to monitor and adjust per consult.   Ulice Dash D  11:39 AM

## 2016-07-30 NOTE — Progress Notes (Signed)
Joyce Dyer, NP notified of pt's elevated HR that is not responsive to Metoprolol, pt's work on breathing on ventilator. Same ordered Fentanyl be increased to 136mcg/hr. [place]

## 2016-07-30 NOTE — Progress Notes (Signed)
5 mg metoprolol given once per order for afib rate 130-150's and has not brought heart rate down therefore Dr. Saralyn Pilar was paged and called back and RN made MD aware of heart rate even after 5 mg of lopressor was given and of current blood pressure supported by neo drip.  RN also made MD aware that Dr. Alva Garnet had stopped amio drip at 0930 this morning.  Dr. Saralyn Pilar gave order for 15 mg of cardizem now and then give another 15 mg of cardizem 15 minutes after first dose and then start cardizem drip at 12 mg/H.

## 2016-07-30 NOTE — Discharge Summary (Signed)
  DEATH SUMMARY  Name: [name] MRN:   [id number] DOB:   Jul 21, 1941           ADMISSION DATE:  07/14/2016 CONSULTATION DATE:  August 04, 2016 Time Of Death:  22:05  Date Of Death: 08/05/2016   Joyce Horton was a 75 yo female with mild dementia, macular degeneration, CAF who usually lives semi-independently underwent T11 kyphoplasty 10/05, then suffered fall at home with L proximal humeral fracture 10/15. Admitted 10/22 to Hospitalist service with AMS initially thought to be due to opiate analgesic medications. Developed progressive agitation, confusion, respiratory distress, hypotension, oliguric AKI requiring intubation early 05-Aug-2023.  Patient was also noted to be be in Afib with RVR . Patient  Was noted to have cholelithiasis and possible cholecystitis, but patient was deemed a poor surgical candidate for surgery. Her lft's were elevated due to shock liver.  Patient was noted to have severe coagulation disorder. Patient's medical condition progressively declined and family was made aware of the poor prognosis.  Family made the decision to make her comfort care.  Extubation orders were  Placed.  Family was present at the bedside.  Patient  Passed away on 05-Aug-2016 @22 :05.  Cause Of Death  1. Acute respiratory failure with hypoxemia 2. Pulmonary infiltrates - likely ARDS 3. Severe Metabolic Acidosis 4. Septic Shock 5. GNR bacteremia 6. Afib with RVR 7. Acute Kidney Injury 8. Shock liver 9 Gallbladder dilation concerns for cholecystitis 10. Severe Coagulation disorder 11. Severe Sepsis    Bincy Varughese,AG-ACNP Pulmonary & Critical Care

## 2016-07-30 NOTE — Progress Notes (Signed)
[facility]  ROUNDING NOTE   Subjective:   Phenylephrine gtt  HD temp cath placed.   Objective:  Vital signs in last 24 hours:  Temp:  [97.7 F (36.5 C)-100 F (37.8 C)] 97.7 F (36.5 C) (10/25 0750) Pulse Rate:  [27-127] 50 (10/25 1100) Resp:  [11-22] 15 (10/25 1100) BP: (92-130)/(54-82) 92/62 (10/25 1100) SpO2:  [90 %-100 %] 94 % (10/25 1100) Arterial Line BP: (83-108)/(50-70) 83/50 (10/25 1100) FiO2 (%):  [40 %-50 %] 50 % (10/25 0750)  Weight change:  Filed Weights   07/28/2016 1345  Weight: 75.5 kg (166 lb 8 oz)    Intake/Output: I/O last 3 completed shifts: In: 6652.1 [I.V.:4962.1; Blood:340; IV OEUMPNTIR:4431] Out: 540 [GQQPY:195; Other:350]   Intake/Output this shift:  Total I/O In: 646.8 [I.V.:446.8; IV Piggyback:200] Out: -   Physical Exam: General: Critically ill  Head: ETT OGT  Eyes: Anicteric, PERRL  Neck: Supple, trachea midline  Lungs:  PRVC FiO 60%  Heart: irregular  Abdomen:  Soft, nontender,   Extremities: ++ peripheral edema.  Neurologic: Intubated, sedated  Skin: No lesions  Access: Left femoral temp HD catheter 09/32    Basic Metabolic Panel:  Recent Labs Lab 07/20/2016 1406 07/28/16 0410 07/29/16 0028 07/29/16 0523  0438  NA 133* 135 136 134* 134*  K 5.3* 4.7 6.5* 5.7* 4.7  CL 99* 105 109 102 98*  CO2 21* 21* 12* 18* 23  GLUCOSE 143* 116* 127* 284* 148*  BUN 67* 67* 76* 73* 82*  CREATININE 2.63* 2.11* 2.82* 2.95* 3.24*  CALCIUM 9.3 8.1* 7.0* 6.9* 6.7*  MG  --   --   --  1.8  --   PHOS  --   --   --  6.5*  --     Liver Function Tests:  Recent Labs Lab 07/17/2016 1406 07/29/16 0523  0438  AST 38 2,685* 2,541*  ALT 14 843* 2,207*  ALKPHOS 107 86 169*  BILITOT 2.4* 3.1* 5.2*  PROT 5.7* 4.3* 4.6*  ALBUMIN 2.4* 1.8* 1.8*   No results for input(s): LIPASE, AMYLASE in the last 168 hours. No results for input(s): AMMONIA in the last 168 hours.  CBC:  Recent Labs Lab 07/17/2016 1406  07/28/16 0012 07/28/16 0410 07/29/16 0028 07/29/16 0523  0438  WBC 16.9*  --  18.0* 27.6* 29.2* 32.1*  NEUTROABS 14.9*  --   --  25.6*  --   --   HGB 7.0* 8.0* 7.9* 7.5* 9.1* 9.2*  HCT 22.3*  --  24.4* 25.3* 27.8* 29.2*  MCV 75.4*  --  75.2* 79.9* 80.5 78.7*  PLT 622*  --  531* 483* 480* 377    Cardiac Enzymes:  Recent Labs Lab 07/16/2016 1406  TROPONINI <0.03    BNP: Invalid input(s): POCBNP  CBG:  Recent Labs Lab 07/21/2016 1956  0044  GLUCAP 59* 130*    Microbiology: Results for orders placed or performed during the hospital encounter of 07/08/2016  Culture, blood (routine x 2)     Status: None (Preliminary result)   Collection Time: 07/11/2016  2:04 PM  Result Value Ref Range Status   Specimen Description BLOOD LEFT HAND  Final   Special Requests BOTTLES DRAWN AEROBIC AND ANAEROBIC 1CC  Final   Culture NO GROWTH 3 DAYS  Final   Report Status PENDING  Incomplete  Culture, blood (routine x 2)     Status: None (Preliminary result)   Collection Time: 08/04/2016  2:06 PM  Result Value Ref Range Status  Specimen Description BLOOD RIGHT ASSIST CONTROL  Final   Special Requests BOTTLES DRAWN AEROBIC AND ANAEROBIC 5CC  Final   Culture NO GROWTH 3 DAYS  Final   Report Status PENDING  Incomplete  Urine culture     Status: Abnormal   Collection Time: 08/01/2016  2:47 PM  Result Value Ref Range Status   Specimen Description URINE, CLEAN CATCH  Final   Special Requests NONE  Final   Culture >=100,000 COLONIES/mL ESCHERICHIA COLI (A)  Final   Report Status 07/29/2016 FINAL  Final   Organism ID, Bacteria ESCHERICHIA COLI (A)  Final      Susceptibility   Escherichia coli - MIC*    AMPICILLIN <=2 SENSITIVE Sensitive     CEFAZOLIN <=4 SENSITIVE Sensitive     CEFTRIAXONE <=1 SENSITIVE Sensitive     CIPROFLOXACIN 0.5 SENSITIVE Sensitive     GENTAMICIN <=1 SENSITIVE Sensitive     IMIPENEM 0.5 SENSITIVE Sensitive     NITROFURANTOIN <=16 SENSITIVE Sensitive      TRIMETH/SULFA <=20 SENSITIVE Sensitive     AMPICILLIN/SULBACTAM <=2 SENSITIVE Sensitive     PIP/TAZO <=4 SENSITIVE Sensitive     Extended ESBL NEGATIVE Sensitive     * >=100,000 COLONIES/mL ESCHERICHIA COLI  MRSA PCR Screening     Status: None   Collection Time: 07/15/2016  7:00 PM  Result Value Ref Range Status   MRSA by PCR NEGATIVE NEGATIVE Final    Comment:        The GeneXpert MRSA Assay (FDA approved for NASAL specimens only), is one component of a comprehensive MRSA colonization surveillance program. It is not intended to diagnose MRSA infection nor to guide or monitor treatment for MRSA infections.     Coagulation Studies:  Recent Labs  07/29/16 1308  0438  LABPROT 62.4* 41.4*  INR 6.97* 4.17*    Urinalysis:  Recent Labs  07/14/2016 1447  COLORURINE AMBER*  LABSPEC 1.016  PHURINE 5.0  GLUCOSEU NEGATIVE  HGBUR NEGATIVE  BILIRUBINUR NEGATIVE  KETONESUR NEGATIVE  PROTEINUR NEGATIVE  NITRITE NEGATIVE  LEUKOCYTESUR NEGATIVE      Imaging: Ct Abdomen Pelvis Wo Contrast  Result Date: 07/28/2016 CLINICAL DATA:  Altered mental status, hypoxia, sepsis of unknown etiology, acute renal failure. EXAM: CT ABDOMEN AND PELVIS WITHOUT CONTRAST TECHNIQUE: Multidetector CT imaging of the abdomen and pelvis was performed following the standard protocol without IV contrast. COMPARISON:  PET-CT 08/02/2009, kyphoplasty images from 07/10/2016 FINDINGS: Lower chest: Cardiomegaly with coronary arteriosclerosis. No pericardial effusion nor thickening. Small partially loculated right pleural effusion with adjacent atelectasis. Small left pleural effusion with compressive atelectasis. Hepatobiliary: Gallbladder is distended with gallstones along the dependent aspect. The unenhanced liver is unremarkable. No biliary dilatation. Pancreas: Atrophic without focal mass or ductal dilatation Spleen: No splenomegaly Adrenals/Urinary Tract: Normal adrenal glands bilaterally. Nonobstructing  left upper pole 2 mm renal calculus. Small exophytic appearing cyst off the upper pole of the left kidney the larger which measures 17 mm. Urinary bladder is nondistended and which may account for the slightly thick-walled appearance. Stomach/Bowel: No bowel age obstruction. There are few mildly distended fluid and contrast filled small bowel loops predominantly within the lower pelvis measuring up to 2.1 cm in caliber. The appendix is not distended in appearance. Vascular/Lymphatic: Mild aortic, right common iliac and side branch atherosclerosis. Reproductive: Calcified left-sided uterine fibroids. Other: Small amount of ascites. Anasarca. Small fat containing umbilical hernia. Musculoskeletal: Right hip fixation with femoral nail. T10 chronic moderate compression. T11 kyphoplasty. Degenerative disc disease from T10 through  L2. IMPRESSION: 1. Moderate to marked distention of the gallbladder with cholelithiasis. Cholecystitis is not excluded. Surrounding small volume ascites about the liver, spleen and within the pelvis. 2. Nonobstructing left upper pole to mid ureteral calculus. Small upper pole left renal cysts. 3. Partially loculated small right pleural effusion with atelectasis. Small left effusion without definite loculations. 4. Multiple loops of mildly distended fluid filled small bowel predominantly in the pelvis. Enteritis not excluded. No bowel obstruction. 5. Cardiomegaly with coronary arteriosclerosis. Electronically Signed   By: Ashley Royalty M.D.   On: 07/28/2016 16:45   Dg Abd 1 View  Result Date: 07/29/2016 CLINICAL DATA:  NG tube placement. EXAM: ABDOMEN - 1 VIEW COMPARISON:  CT 07/28/2016. FINDINGS: NG tube noted coiled in the stomach. Distended loops of small bowel again noted. Colonic gas pattern is normal. No free air. Prior T11 vertebroplasty. IMPRESSION: 1. NG tube noted coiled stomach. 2. Distended loops of small bowel again noted . Colonic gas pattern normal. No free air. Electronically  Signed   By: Marcello Moores  Register   On: 07/29/2016 07:03   Dg Chest Port 1 View  Result Date:  CLINICAL DATA:  Respiratory failure. EXAM: PORTABLE CHEST 1 VIEW COMPARISON:  Radiograph of July 29, 2016. FINDINGS: Stable cardiomegaly. Nasogastric tube is seen entering the stomach. Left internal jugular catheter is again noted with distal tip in expected position of the SVC. Endotracheal tube is now seen with distal tip only 6 mm above the carina. Withdrawal by 2-3 cm is recommended. Atherosclerosis of thoracic aorta is noted. Hypoinflation of the lungs is noted. Increased bilateral perihilar and basilar interstitial densities are noted concerning for edema or inflammation. No pneumothorax is noted. Possible mild bilateral pleural effusions may be present. IMPRESSION: Hypoinflation of the lungs is noted. Increased bilateral perihilar and basilar interstitial densities are noted concerning for edema or inflammation. Aortic atherosclerosis. Endotracheal tube is now seen with distal tip only 6 mm above the carina. Withdrawal by 2-3 cm is recommended. These results will be called to the ordering clinician or representative by the Radiologist Assistant, and communication documented in the PACS or zVision Dashboard. Electronically Signed   By: Marijo Conception, M.D.   On:  07:54   Dg Chest Port 1 View  Result Date: 07/29/2016 CLINICAL DATA:  Unresponsive, intubation. EXAM: PORTABLE CHEST 1 VIEW COMPARISON:  Chest radiograph July 29, 2016 at 0106 hours. FINDINGS: Interval intubation, distal tip projects 5.1 cm above the carina. Stable position of LEFT internal jugular central venous catheter with distal tip projecting in mid superior vena cava. No pneumothorax. Cardiac silhouette is moderately enlarged unchanged. Similarly widened mediastinum. Small pleural effusions. Apical pleural thickening. Soft tissue planes included osseous structures are unchanged. Old LEFT rib fractures. Old T11  compression fracture, status post cement augmentation. Old T10 compression fracture. IMPRESSION: Endotracheal tube tip projects 5.1 cm above the carina. Stable LEFT IJ line. Stable cardiomegaly, widened mediastinum attributed to vascular structures. Small pleural effusions. Electronically Signed   By: Elon Alas M.D.   On: 07/29/2016 03:34   Dg Chest Port 1 View  Result Date: 07/29/2016 CLINICAL DATA:  75 year old female with central line placement. EXAM: PORTABLE CHEST 1 VIEW COMPARISON:  Chest radiograph dated 07/28/2016 FINDINGS: There has been interval placement of a left IJ central line with tip over central SVC. No pneumothorax. There is no focal consolidation. Blunted appearance of the costophrenic angles may be related to atelectatic changes or trace pleural effusion. There is moderate to severe cardiomegaly. There is widened  appearance of the mediastinum likely related to aortic tortuosity and slightly rotated positioning of the patient. A mediastinal mass or vascular injury is not entirely excluded. CT with contrast is recommended if there is high clinical concern for mediastinal pathology. Osteopenia with thoracic scoliosis and lower thoracic vertebroplasty. Sclerotic changes of the left glenoid and humeral head. No definite acute fracture. IMPRESSION: Left IJ central line with tip over central SVC.  No pneumothorax. Widened appearance of the mediastinum relatively similar to prior radiographs, likely related to cardiomegaly and tortuosity of the aorta. Clinical correlation is recommended. No focal consolidation. Electronically Signed   By: Anner Crete M.D.   On: 07/29/2016 03:15   US Abdomen Limited Ruq  Result Date: 07/29/2016 CLINICAL DATA:  75 year old female with abdominal pain. Abnormal CT. Subsequent encounter. EXAM: US ABDOMEN LIMITED - RIGHT UPPER QUADRANT COMPARISON:  07/28/2016 CT. FINDINGS: Gallbladder: Distended gallbladder. Gallstone measuring up to 8 mm.  Pericholecystic fluid. Mild gallbladder wall thickening. Patient was not tender over this region during scanning per ultrasound technologist. Common bile duct: Diameter: 6 mm. Liver: No focal lesion identified. Within normal limits in parenchymal echogenicity. IMPRESSION: Distended gallbladder. Gallstone measuring up to 8 mm. Pericholecystic fluid. Mild gallbladder wall thickening. Patient was not tender over this region during scanning per ultrasound technologist. Cholecystitis cannot be excluded in proper clinical setting. There is third spacing of fluid which may contribute to this appearance. Electronically Signed   By: Genia Del M.D.   On: 07/29/2016 10:03     Medications:   . fentaNYL infusion INTRAVENOUS 75 mcg/hr ( 1100)  . phenylephrine (NEO-SYNEPHRINE) Adult infusion 70.133 mcg/min ( 1100)  .  sodium bicarbonate 150 mEq in sterile water 1000 mL infusion 75 mL/hr at  1100   . cefTAZidime (FORTAZ)  IV  500 mg Intravenous Daily  . chlorhexidine gluconate (MEDLINE KIT)  15 mL Mouth Rinse BID  . famotidine (PEPCID) IV  20 mg Intravenous Daily  . mouth rinse  15 mL Mouth Rinse 10 times per day  . metronidazole  500 mg Intravenous Q8H  . sodium chloride flush  10-40 mL Intracatheter Q12H  . sodium chloride flush  3 mL Intravenous Q12H  . vancomycin  750 mg Intravenous Q48H   Place/Maintain arterial line **AND** sodium chloride, acetaminophen **OR** acetaminophen, fentaNYL (SUBLIMAZE) injection, heparin, ipratropium-albuterol, metoprolol, midazolam, ondansetron **OR** ondansetron (ZOFRAN) IV, sodium chloride flush  Assessment/ Plan:  Ms. Joyce Horton is a 75 y.o. white female with dementia, hypertension, history of breast cancer, GERD, osteoporosis, peptic ulcer disease, peripheral vascular disease who was admitted to [facility] on 07/18/2016 for Sepsis  1. Acute Renal Failure with hyperkalemia, metabolic acidosis and volume overload: anuric urine output.  Hemodynamically unstable requiring vasopressors and shock dose steroids.  Baseline creatinine of 0.58 - continue bicarb gtt - start CRRT: orders prepared  2. Sepsis: urinary tract infection: E. Coli - empirically on vancomycin and ceftrazidime.    3. Anemia with renal failure: status post 1 unit PRBC   4. Acute respiratory failure: requiring mechanical ventilation  5. Atrial Fibrillation:  On amiodarone   LOS: [place], [place] 10/25/201711:35 AM

## 2016-07-30 NOTE — Progress Notes (Signed)
Spoke with Hinton Dyer, NP and made her aware that patient continues to have guppy breathing even after versed PRN was given.  NP came to bedside and asked nurse to let her know when patient's son arrives so that she can speak with him about patient's status.

## 2016-07-30 NOTE — Progress Notes (Signed)
Discussed with pts son pts poor prognosis and concern about decline in respiratory status despite being mechanically ventilated.  Her heart rate is currently in the 140 to 150's despite administration of iv metoprolol.  Discussed the option of comfort care he states he understands her prognosis is poor he states he would like more time prior to making the decision to transition to comfort care.    Joyce Horton, [place] Pager [phone] (please enter 7 digits) PCCM Consult Pager [phone] (please enter 7 digits)

## 2016-07-30 NOTE — Progress Notes (Signed)
PULMONARY / CRITICAL CARE MEDICINE   Name: Joyce Horton MRN: [id number] DOB: [birth year]    ADMISSION DATE:  07/09/2016 CONSULTATION DATE:  07/29/2016  REFERRING MD:  Dr. Jannifer Franklin  CHIEF COMPLAINT:  Altered Mental Status  PT PROFILE: 73 F with mild dementia, macular degeneration, CAF who usually lives semi-independently underwent T11 kyphoplasty 10/05, then suffered fall at home with L proximal humeral fracture 10/15. Admitted 10/22 to Hospitalist service with AMS initially thought to be due to opiate analgesic medications. Developed progressive agitation, confusion, respiratory distress, hypotension, oliguric AKI requiring intubation early AM 10/24 with PCCM consultation and assumption of care.   MAJOR EVENTS/TEST RESULTS: 10/05 T11 kyphoplasty 10/15 ED visit after fall, AMS - L humeral fracture. CT head: No acute findings 10/20 CT L shoulder: Markedly displaced 2-part surgical neck fracture, with the humeral shaft displaced about 3.3 cm anterior to the humeral head 10/22 Admitted to Hospitalist Service with [place] 10/22 transfused one unit PRBCs for Hgb 7.0 10/23 AFRVR, amiodarone initiated. Cardiology consultation 10/23 CTAP: Moderate to marked distention of the gallbladder with cholelithiasis. Cholecystitis is not excluded. Multiple loops of mildly distended fluid filled small bowel predominantly in the pelvis. Enteritis not excluded 10/24 early AM developed respiratory distress, intubated. Vasopressors for hypotension 10/24 Renal consultation for oliguric AKI, hyperkalemia, metabolic acidosis [id number] Gen Surg consultation. Recommended GI eval 10/24 Goals of care with pt's son who acknowledges poor prognosis but wishes for time limited trial of CRRT. Pt made DNR in event of cardiac arrest 10/24 RUQ [place]: Distended gallbladder. Gallstone measuring up to 8 mm. Pericholecystic fluid. Mild gallbladder wall thickening. Patient was not tender over this region during scanning. Cholecystitis cannot  be excluded  10/25 GI medicine consultation 10/25 HD cath paced in anticipation of CRRT  INDWELLING DEVICES:: ETT 10/24 >>  R femoral A-line 10/24 >>  L IJ CVL 10/24 >>  L femoral HD cath 10/25 >>   MICRO DATA: MRSA PCR 10/22 >> NEG Urine 10/22 >> E coli (pansens) Blood 10/22 >>   ANTIMICROBIALS:  Pip-tazo 10/22 >> 10/24 Vanc 10/22 >>  Ceftaz 10/24 >>  Metronidazole 10/24 >>    SUBJECTIVE:  RASS -3, -4 on fentanyl gtt. Not F/C  VITAL SIGNS: BP 123/64   Pulse (!) 111   Temp 97.7 F (36.5 C) (Oral)   Resp 20   Ht 5' (1.524 m)   Wt 166 lb 8 oz (75.5 kg)   SpO2 100%   BMI 32.52 kg/m   HEMODYNAMICS: CVP:  [12 mmHg-22 mmHg] 18 mmHg  VENTILATOR SETTINGS: Vent Mode: PRVC FiO2 (%):  [40 %-50 %] 50 % Set Rate:  [14 bmp] 14 bmp Vt Set:  [450 mL] 450 mL PEEP:  [5 cmH20] 5 cmH20 Plateau Pressure:  [17 cmH20-18 cmH20] 17 cmH20  INTAKE / OUTPUT: I/O last 3 completed shifts: In: 6652.1 [I.V.:4962.1; Blood:340; IV [id number] Out: [id number]; Other:350]  PHYSICAL EXAMINATION: General: RASS -4, not F/C Neuro:  Minimally responsive to painful stimulation, PERRL HEENT: NCAT, sclericterus Cardiovascular:  IRIR, mildly tachy, no M Lungs: Synchronous, no wheezes Abdomen: obese, firm, not rigid, diminished BS Ext: L shoulder immobilizer, extensive ecchymosis around L shoulder, 2+ symmetric LE edema  LABS:  BMET  Recent Labs Lab 07/29/16 0028 07/29/16 0523  0438  NA 136 134* 134*  K 6.5* 5.7* 4.7  CL 109 102 98*  CO2 12* 18* 23  BUN 76* 73* 82*  CREATININE 2.82* 2.95* 3.24*  GLUCOSE 127* 284* 148*    Electrolytes  Recent Labs Lab  07/29/16 0028 07/29/16 0523  0438  CALCIUM 7.0* 6.9* 6.7*  MG  --  1.8  --   PHOS  --  6.5*  --     CBC  Recent Labs Lab 07/29/16 0028 07/29/16 0523  0438  WBC 27.6* 29.2* 32.1*  HGB 7.5* 9.1* 9.2*  HCT 25.3* 27.8* 29.2*  PLT 483* 480* 377    Coag's  Recent Labs Lab  07/29/16 1308  0438  INR 6.97* 4.17*    Sepsis Markers  Recent Labs Lab 07/29/16 0028 07/29/16 0523 07/29/16 0805  0438  LATICACIDVEN 6.0* 5.6* 4.3*  --   PROCALCITON 2.78  --   --  5.01    ABG  Recent Labs Lab 07/29/16 0030 07/29/16 0342 07/29/16 0700  PHART 7.26* 7.17* 7.22*  PCO2ART 26* 41 39  PO2ART 104 107 107    Liver Enzymes  Recent Labs Lab 07/22/2016 1406 07/29/16 0523  0438  AST 38 2,685* 2,541*  ALT 14 843* 2,207*  ALKPHOS 107 86 169*  BILITOT 2.4* 3.1* 5.2*  ALBUMIN 2.4* 1.8* 1.8*    Cardiac Enzymes  Recent Labs Lab 07/13/2016 1406  TROPONINI <0.03    Glucose  Recent Labs Lab 07/23/2016 1956  0044  GLUCAP 129* 130*    CXR: NACPD   ASSESSMENT / PLAN:  PULMONARY A: Acute respiratory failure due to AMS, acidosis P:   Cont full vent support - settings reviewed and/or adjusted Cont vent bundle Daily SBT if/when meets criteria  CARDIOVASCULAR A:  Septic shock CAF, rate reasonably well controlled Hx: Hypertension P:  Holding outpatient meds - eliquis, lasix, cardizem, and metoprolol  Cardiology input noted Will DC amiodarone due to elevated LFTs Phenylephrine gtt to maintain MAP > 65 mmHg Low dose PRN metoprolol to maintain HR < 115/min  RENAL A:   AKI, oliguric Hyperkalemia Metabolic acidosis  P:   Monitor BMET intermittently Monitor I/Os Correct electrolytes as indicated Renal Service following Anticipate CRRT to begin today  GASTROINTESTINAL A:   Gallbladder dilatation concern for cholecystitis  Markedly elevated LFTs Hx: GERD  P:   SUP: IV famotidine No TFs for now pending GI med eval  HEMATOLOGIC A:   Anemia without overt blood loss Coagulopathy due to acute hepatic failure, sepsis Hx: breast cancer P:  DVT px: SCDs Monitor CBC intermittently Transfuse per usual guidelines  INFECTIOUS A:   Severe sepsis - suspect UTI vs cholecystitis P:   Monitor temp, WBC  count Micro and abx as above  ENDOCRINE A:   Mild hyperglycemia P:   Monitor serum glucose Consider SSI for glu > 180  NEUROLOGIC A: Acute encephalopathy Acute pain P:   RASS goal: 0 to -1 Cont fentanyl infusion WUA daily    FAMILY: face to face meeting with son planned today  CCM time: 40 mins The above time includes time spent in consultation with patient and/or family members and reviewing care plan on multidisciplinary rounds  Merton Border, MD PCCM service Mobile [phone] Pager [phone]

## 2016-07-30 NOTE — Progress Notes (Signed)
Per Dr. Alva Garnet order, the ETT was pulled back 2 cm. It is now at 21 at the lip.

## 2016-07-30 NOTE — Consult Note (Signed)
GI Inpatient Consult Note  Reason for Consult: Elevated LFTs   Attending Requesting Consult: Dr. Alva Garnet  History of Present Illness:  Joyce Horton is a 75 y.o. female with a known history of Afib (on Eliquis), h/o breast cancer (2011, s/p chemo & XRT), anemia, HTN, osteoporosis, obesity, and dementi admitted on 07/06/2016 with sepsis.  Patient presented to the [facility] ED with AMS, poor oral intake, and decreased urination.  Temp of 100.5, tachycardia, elevated lactate, and O2 sats in mid-60s noted in the ED.  Labs demonstrated Hgb 7.0 (prior baseline of 9), BUN 67, Cr 2.63.  Electrolyte imbalances due to AKI also noted.  UA negative for UTI.  ECG and CXR largely WNL.    Since admission, patient developed progressive agitation, confusion, hypotension, and respiratory distress requiring intubation and sedation.  She was found to be in A fib w/ RVR.  Patient remains intubated and sedated at this time.  Review of labs show blood cultures and MRSA screen have returned negative.  Hgb remains stable at 7.9.  However, lactic acid has continued to increase to 6.0.  LFTs also elevated this morning with AST 2,685, ALT 843, T bili 3.1, direct bili 1.7, indirect bili 1.4; LFTs previously WNL at last check on 10/22.  Hypoalbuminema and hypoproteinemia also noted.  Patient underwent a non-contrast CT a/p on 07/28/16 to evaluate AMS, hypoxia, sepsis, and ARF.  Moderate to marked distention of the gallbladder w/ cholelithiasis was noted.  Small volume ascites surrounding the liver, spleen, and w/in pelvis also noted.  Small bowel was mildly distended with multiple fluid-filled loops.  A nonobstructing left kidney cyst and partially loculated small right PE w/ atelectasis also noted.  A subsequent RUQ [place] again revealed a distended gallbladder with a gallstone measuring up to 5m and mild gallbladder wall thickening.  Importantly, third-spacing of fluid was noted by radiologist, which may contribute to these  findings.  Patient was evaluated by Dr. WAdonis Huguenin and no surgical intervention was recommended due to patient's unstable condition, worsening renal function, lactic acidosis.  GI was consulted for further evaluation.  Past Medical History:  Past Medical History:  Diagnosis Date  . Anemia   . [place]rial [place] ([place]   . 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 Neoadjuvant chemotherapy. Wide local excision, sentinel node biopsy completed May 2011 followed by whole breast radiation.  . Fall 2012  . GERD (gastroesophageal reflux disease)   . History of chemotherapy   . Hypertension   . Obesity   . Osteoporosis   . Ulcer ([place]     Problem List: Patient Active Problem List   Diagnosis Date Noted  . Sepsis ([place] 07/20/2016  . [place]fib ([place] 12/24/2015  . Fat necrosis (segmental) of breast 08/18/2014  . Cancer of central portion of left female breast ([place] 08/19/2013    Past Surgical History: Past Surgical History:  Procedure Laterality Date  . BREAST BIOPSY  2000,2007,2010   left breast 2010  . BREAST SURGERY Left 2011   lumpectomy  . COLONOSCOPY  2002,2007,2010   [place]  . KYPHOPLASTY N/A 07/10/2016   Procedure: KYPHOPLASTY  T 11;  Surgeon: MHessie Knows MD;  Location: [facility] ORS;  Service: Orthopedics;  Laterality: N/A;  . SKIN LESION EXCISION  [id number] . stents     R leg  . TIBIA FRACTURE SURGERY Right July 2013   right femur fracture with internal fixation.    Allergies: Allergies  Allergen Reactions  . Fentanyl Other (See  Comments)    Hallucinations and mental alterations    Home Medications: Prescriptions Prior to Admission  Medication Sig Dispense Refill Last Dose  . acetaminophen (TYLENOL) 500 MG tablet Take 500 mg by mouth every 6 (six) hours as needed for moderate pain or fever.    PRN at PRN  . ALPRAZolam (XANAX) 0.25 MG tablet Take 0.25 mg by mouth at bedtime as needed for anxiety.   PRN at PRN  . apixaban (ELIQUIS)  5 MG TABS tablet Take 1 tablet (5 mg total) by mouth 2 (two) times daily. 180 tablet 0 07/26/2016 at 2200  . Calcium-Vitamin D (CALTRATE 600 PLUS-VIT D PO) Take 1 tablet by mouth daily.    07/26/2016 at am  . cetirizine (ZYRTEC) 10 MG tablet Take 10 mg by mouth daily as needed for allergies.   PRN at PRN  . diltiazem (CARDIZEM CD) 180 MG 24 hr capsule Take 1 capsule (180 mg total) by mouth daily. 90 capsule 0 07/26/2016 at am  . donepezil (ARICEPT) 10 MG tablet Take 10 mg by mouth at bedtime.  12 07/26/2016 at pm  . FLUoxetine (PROZAC) 20 MG tablet Take 20 mg by mouth daily.   07/26/2016 at pm  . furosemide (LASIX) 40 MG tablet Take 40 mg by mouth daily.   07/26/2016 at am  . HYDROcodone-acetaminophen (NORCO/VICODIN) 5-325 MG tablet Take 1 tablet by mouth every 6 (six) hours as needed for moderate pain. 20 tablet 0 PRN at PRN  . metoprolol (LOPRESSOR) 100 MG tablet Take 1 tablet (100 mg total) by mouth 2 (two) times daily. 60 tablet 0 07/26/2016 at 2200  . Multiple Vitamins-Minerals (MULTIVITAMIN WITH MINERALS) tablet Take 1 tablet by mouth daily.   07/26/2016 at am  . naproxen sodium (ANAPROX) 220 MG tablet Take 220 mg by mouth 2 (two) times daily as needed. For pain   PRN at PRN  . oxybutynin (DITROPAN) 5 MG tablet Take 5 mg by mouth 2 (two) times daily.   07/26/2016 at pm  . potassium chloride SA (K-DUR,KLOR-CON) 20 MEQ tablet Take 20 mEq by mouth daily.   07/26/2016 at am   Home medication reconciliation was completed with the patient.   Scheduled Inpatient Medications:   . cefTAZidime (FORTAZ)  IV  2 g Intravenous Q12H  . chlorhexidine gluconate (MEDLINE KIT)  15 mL Mouth Rinse BID  . [START ON 08/01/2016] famotidine (PEPCID) IV  20 mg Intravenous Q48H  . mouth rinse  15 mL Mouth Rinse 10 times per day  . metronidazole  500 mg Intravenous Q8H  . sodium chloride flush  10-40 mL Intracatheter Q12H  . sodium chloride flush  3 mL Intravenous Q12H  . vancomycin  1,000 mg Intravenous Q24H     Continuous Inpatient Infusions:   . fentaNYL infusion INTRAVENOUS 75 mcg/hr ( 1200)  . phenylephrine (NEO-SYNEPHRINE) Adult infusion 80 mcg/min ( 1200)  . pureflow    .  sodium bicarbonate 150 mEq in sterile water 1000 mL infusion 75 mL/hr at  1200    PRN Inpatient Medications:  Place/Maintain arterial line **AND** sodium chloride, acetaminophen **OR** acetaminophen, fentaNYL (SUBLIMAZE) injection, heparin, ipratropium-albuterol, metoprolol, midazolam, ondansetron **OR** ondansetron (ZOFRAN) IV, sodium chloride flush  Family History: family history includes CAD in her father; Cancer in her mother; Heart failure in her father; Kidney failure in her mother.   Social History:   reports that she has never smoked. She has never used smokeless tobacco. She reports that she drinks alcohol. She reports that she does not  use drugs.   Review of Systems: Unable to obtain due to intubation and sedation.   Physical Examination: BP 109/60   Pulse (!) 112   Temp 97.7 F (36.5 C) (Oral)   Resp (!) 21   Ht 5' (1.524 m)   Wt 75.5 kg (166 lb 8 oz)   SpO2 93%   BMI 32.52 kg/m  Gen: NAD, resting comfortably on ventilator HEENT: PEERLA Neck: supple, no JVD or thyromegaly Chest: CTA bilaterally, no wheezes, crackles, or other adventitious sounds CV: IRIR, mild tachycardia, no m/g/c/r Abd: obese, NT, ND, +BS in all four quadrants; no HSM, guarding, ridigity, or rebound tenderness Ext: well perfused with 2+ pulses, 2+ LE edema bilaterally, L shoulder immobilizer in place Skin: no rash or lesions noted Lymph: no LAD  Data: Lab Results  Component Value Date   WBC 32.1 (H)    HGB 9.2 (L)    HCT 29.2 (L)    MCV 78.7 (L)    PLT 377     Recent Labs Lab 07/29/16 0028 07/29/16 0523  0438  HGB 7.5* 9.1* 9.2*   Lab Results  Component Value Date   NA 134 (L)    K 4.7    CL 98 (L)     CO2 23    BUN 82 (H)    CREATININE 3.24 (H)    Lab Results  Component Value Date   ALT 2,207 (H)    AST 2,541 (H)    ALKPHOS 169 (H)    BILITOT 5.2 (H)     Recent Labs Lab  0438  INR 4.17*   Assessment/Plan: Ms. Bassford is a 75 y.o. female with a known history of Afib (on Eliquis), h/o breast cancer (2011, s/p chemo & XRT), anemia, HTN, osteoporosis, obesity, and dementia admitted on 08/04/2016 with sepsis.  Patient remains intubated and sedated due to worsening metal status, agitation, and respiratory depression.  Etiology of sepsis remains unclear, though lactic acid and renal function continue to worsen.  AST and ALT now significantly elevated, with elevated bilirubin also noted.  Non-contrast CT was notable for cholelithiasis and possible cholecystitis, but patient was deemed a poor surgical candidate due to current severe illness.  Severe elevations in LFTs more likely due to shock liver rather than bilary disease given patient's acute illness.  Choledocolithiasis , and cholangitis remain considerations as well, but patient is not a candidate for endoscopic procedures at time due given comorbidities.  Therefore, recommend continuing to monitor LFTs daily.  No further intervention is planned at this time pending patient's clinical progression.  Recommendations: - Continue monitoring serial LFTs - Further recs per Dr. Vira Agar  Thank you for the consult. We will follow along with you. Please call with questions or concerns.  Lavera Guise, PA-C [facility] Phone: [phone] Pager: [phone]

## 2016-07-30 NOTE — Consult Note (Signed)
See note from [facility].  Pt with shock liver, either most likely from sepsis of uncertain origin (urinary tract?) or inflamed  Gall bladder. Not a surgical candidate.  Severe coagulation disorders, very high AST and ALT, prognosis poor.  No specific GI recommendations.

## 2016-07-30 NOTE — Progress Notes (Signed)
   Family has made decision to make the patient a comfort care and terminally extubate the patient as they are well aware of her critically ill state and poor prognosis.  Comfort care as well as terminal extubation orders are placed.   [facility]

## 2016-07-30 NOTE — Progress Notes (Signed)
Patient's son made decision to withdraw care and make patient comfortable. [place], NP notified and orders received to extubate. Patient terminally extubated at 2144-01-29. Patient's son, Jaquavia Pingel, and two grandchildren at bedside with chaplain.    Patient expired at 01/29/2204 and pronounced by 2 RNs, see flowsheets for details.  [place], NP and [place] notified of time of death. Camera operator and nursing supervisor informed. [facility] called and informed; pt cleared and fully released by CDS-see flowsheets for details.

## 2016-07-30 NOTE — Progress Notes (Signed)
[facility] Cardiology  SUBJECTIVE: Intubated   Vitals:    0630  0700  0730  0750  BP: 110/71 (!) 107/58 94/74   Pulse: (!) 44 (!) 112 (!) 113   Resp: 13 12 13    Temp:      TempSrc:      SpO2: 93% 92% 92% 96%  Weight:      Height:         Intake/Output Summary (Last 24 hours) at  0856 Last data filed at  0700  Gross per 24 hour  Intake          3631.97 ml  Output              452 ml  Net          3179.97 ml      PHYSICAL EXAM  General: Intubated HEENT:  Normocephalic and atramatic Neck:  No JVD.  Lungs: Clear bilaterally to auscultation and percussion. Heart: HRRR . Normal S1 and S2 without gallops or murmurs.  Abdomen: Bowel sounds are positive, abdomen soft and non-tender  Msk:  Back normal, normal gait. Normal strength and tone for age. Extremities: No clubbing, cyanosis or edema.   Neuro: Intubated Psych:  Comatose   LABS: Basic Metabolic Panel:  Recent Labs  07/29/16 0523  0438  NA 134* 134*  K 5.7* 4.7  CL 102 98*  CO2 18* 23  GLUCOSE 284* 148*  BUN 73* 82*  CREATININE 2.95* 3.24*  CALCIUM 6.9* 6.7*  MG 1.8  --   PHOS 6.5*  --    Liver Function Tests:  Recent Labs  07/29/16 0523  0438  AST 2,685* 2,541*  ALT 843* 2,207*  ALKPHOS 86 169*  BILITOT 3.1* 5.2*  PROT 4.3* 4.6*  ALBUMIN 1.8* 1.8*   No results for input(s): LIPASE, AMYLASE in the last 72 hours. CBC:  Recent Labs  07/19/2016 1406  07/29/16 0028 07/29/16 0523  0438  WBC 16.9*  < > 27.6* 29.2* 32.1*  NEUTROABS 14.9*  --  25.6*  --   --   HGB 7.0*  < > 7.5* 9.1* 9.2*  HCT 22.3*  < > 25.3* 27.8* 29.2*  MCV 75.4*  < > 79.9* 80.5 78.7*  PLT 622*  < > 483* 480* 377  < > = values in this interval not displayed. Cardiac Enzymes:  Recent Labs  07/08/2016 1406  TROPONINI <0.03   BNP: Invalid input(s): POCBNP D-Dimer: No results for input(s): DDIMER in the last 72 hours. Hemoglobin A1C: No results for  input(s): HGBA1C in the last 72 hours. Fasting Lipid Panel: No results for input(s): CHOL, HDL, LDLCALC, TRIG, CHOLHDL, LDLDIRECT in the last 72 hours. Thyroid Function Tests: No results for input(s): TSH, T4TOTAL, T3FREE, THYROIDAB in the last 72 hours.  Invalid input(s): FREET3 Anemia Panel: No results for input(s): VITAMINB12, FOLATE, FERRITIN, TIBC, IRON, RETICCTPCT in the last 72 hours.  Ct Abdomen Pelvis Wo Contrast  Result Date: 07/28/2016 CLINICAL DATA:  Altered mental status, hypoxia, sepsis of unknown etiology, acute renal failure. EXAM: CT ABDOMEN AND PELVIS WITHOUT CONTRAST TECHNIQUE: Multidetector CT imaging of the abdomen and pelvis was performed following the standard protocol without IV contrast. COMPARISON:  PET-CT 08/02/2009, kyphoplasty images from 07/10/2016 FINDINGS: Lower chest: Cardiomegaly with coronary arteriosclerosis. No pericardial effusion nor thickening. Small partially loculated right pleural effusion with adjacent atelectasis. Small left pleural effusion with compressive atelectasis. Hepatobiliary: Gallbladder is distended with gallstones along the dependent aspect. The unenhanced liver is unremarkable. No biliary dilatation. Pancreas:  Atrophic without focal mass or ductal dilatation Spleen: No splenomegaly Adrenals/Urinary Tract: Normal adrenal glands bilaterally. Nonobstructing left upper pole 2 mm renal calculus. Small exophytic appearing cyst off the upper pole of the left kidney the larger which measures 17 mm. Urinary bladder is nondistended and which may account for the slightly thick-walled appearance. Stomach/Bowel: No bowel age obstruction. There are few mildly distended fluid and contrast filled small bowel loops predominantly within the lower pelvis measuring up to 2.1 cm in caliber. The appendix is not distended in appearance. Vascular/Lymphatic: Mild aortic, right common iliac and side branch atherosclerosis. Reproductive: Calcified left-sided uterine  fibroids. Other: Small amount of ascites. Anasarca. Small fat containing umbilical hernia. Musculoskeletal: Right hip fixation with femoral nail. T10 chronic moderate compression. T11 kyphoplasty. Degenerative disc disease from T10 through L2. IMPRESSION: 1. Moderate to marked distention of the gallbladder with cholelithiasis. Cholecystitis is not excluded. Surrounding small volume ascites about the liver, spleen and within the pelvis. 2. Nonobstructing left upper pole to mid ureteral calculus. Small upper pole left renal cysts. 3. Partially loculated small right pleural effusion with atelectasis. Small left effusion without definite loculations. 4. Multiple loops of mildly distended fluid filled small bowel predominantly in the pelvis. Enteritis not excluded. No bowel obstruction. 5. Cardiomegaly with coronary arteriosclerosis. Electronically Signed   By: Ashley Royalty M.D.   On: 07/28/2016 16:45   Dg Abd 1 View  Result Date: 07/29/2016 CLINICAL DATA:  NG tube placement. EXAM: ABDOMEN - 1 VIEW COMPARISON:  CT 07/28/2016. FINDINGS: NG tube noted coiled in the stomach. Distended loops of small bowel again noted. Colonic gas pattern is normal. No free air. Prior T11 vertebroplasty. IMPRESSION: 1. NG tube noted coiled stomach. 2. Distended loops of small bowel again noted . Colonic gas pattern normal. No free air. Electronically Signed   By: Marcello Moores  Register   On: 07/29/2016 07:03   Dg Chest Port 1 View  Result Date:  CLINICAL DATA:  Respiratory failure. EXAM: PORTABLE CHEST 1 VIEW COMPARISON:  Radiograph of July 29, 2016. FINDINGS: Stable cardiomegaly. Nasogastric tube is seen entering the stomach. Left internal jugular catheter is again noted with distal tip in expected position of the SVC. Endotracheal tube is now seen with distal tip only 6 mm above the carina. Withdrawal by 2-3 cm is recommended. Atherosclerosis of thoracic aorta is noted. Hypoinflation of the lungs is noted. Increased  bilateral perihilar and basilar interstitial densities are noted concerning for edema or inflammation. No pneumothorax is noted. Possible mild bilateral pleural effusions may be present. IMPRESSION: Hypoinflation of the lungs is noted. Increased bilateral perihilar and basilar interstitial densities are noted concerning for edema or inflammation. Aortic atherosclerosis. Endotracheal tube is now seen with distal tip only 6 mm above the carina. Withdrawal by 2-3 cm is recommended. These results will be called to the ordering clinician or representative by the Radiologist Assistant, and communication documented in the PACS or zVision Dashboard. Electronically Signed   By: Marijo Conception, M.D.   On:  07:54   Dg Chest Port 1 View  Result Date: 07/29/2016 CLINICAL DATA:  Unresponsive, intubation. EXAM: PORTABLE CHEST 1 VIEW COMPARISON:  Chest radiograph July 29, 2016 at 0106 hours. FINDINGS: Interval intubation, distal tip projects 5.1 cm above the carina. Stable position of LEFT internal jugular central venous catheter with distal tip projecting in mid superior vena cava. No pneumothorax. Cardiac silhouette is moderately enlarged unchanged. Similarly widened mediastinum. Small pleural effusions. Apical pleural thickening. Soft tissue planes included osseous structures are  unchanged. Old LEFT rib fractures. Old T11 compression fracture, status post cement augmentation. Old T10 compression fracture. IMPRESSION: Endotracheal tube tip projects 5.1 cm above the carina. Stable LEFT IJ line. Stable cardiomegaly, widened mediastinum attributed to vascular structures. Small pleural effusions. Electronically Signed   By: Elon Alas M.D.   On: 07/29/2016 03:34   Dg Chest Port 1 View  Result Date: 07/29/2016 CLINICAL DATA:  75 year old female with central line placement. EXAM: PORTABLE CHEST 1 VIEW COMPARISON:  Chest radiograph dated 07/15/2016 FINDINGS: There has been interval placement of a left IJ  central line with tip over central SVC. No pneumothorax. There is no focal consolidation. Blunted appearance of the costophrenic angles may be related to atelectatic changes or trace pleural effusion. There is moderate to severe cardiomegaly. There is widened appearance of the mediastinum likely related to aortic tortuosity and slightly rotated positioning of the patient. A mediastinal mass or vascular injury is not entirely excluded. CT with contrast is recommended if there is high clinical concern for mediastinal pathology. Osteopenia with thoracic scoliosis and lower thoracic vertebroplasty. Sclerotic changes of the left glenoid and humeral head. No definite acute fracture. IMPRESSION: Left IJ central line with tip over central SVC.  No pneumothorax. Widened appearance of the mediastinum relatively similar to prior radiographs, likely related to cardiomegaly and tortuosity of the aorta. Clinical correlation is recommended. No focal consolidation. Electronically Signed   By: Anner Crete M.D.   On: 07/29/2016 03:15   US Abdomen Limited Ruq  Result Date: 07/29/2016 CLINICAL DATA:  75 year old female with abdominal pain. Abnormal CT. Subsequent encounter. EXAM: US ABDOMEN LIMITED - RIGHT UPPER QUADRANT COMPARISON:  07/28/2016 CT. FINDINGS: Gallbladder: Distended gallbladder. Gallstone measuring up to 8 mm. Pericholecystic fluid. Mild gallbladder wall thickening. Patient was not tender over this region during scanning per ultrasound technologist. Common bile duct: Diameter: 6 mm. Liver: No focal lesion identified. Within normal limits in parenchymal echogenicity. IMPRESSION: Distended gallbladder. Gallstone measuring up to 8 mm. Pericholecystic fluid. Mild gallbladder wall thickening. Patient was not tender over this region during scanning per ultrasound technologist. Cholecystitis cannot be excluded in proper clinical setting. There is third spacing of fluid which may contribute to this appearance.  Electronically Signed   By: Genia Del M.D.   On: 07/29/2016 10:03     Echo LV EF 65-70%  TELEMETRY: Atrial fibrillation:  ASSESSMENT AND PLAN:  Active Problems:   Sepsis ([place])    1. Atrial fibrillation, tachycardia compensatory for underlying sepsis, respiratory failure, anemia and acute renal failure, relatively well controlled on amiodarone drip 2. Sepsis, with hypertension, on pressors, and wide spectrum antibiotics 3. Respiratory failure, secondary to sepsis 4. Acute kidney injury, likely ATN, [place] is pending 5. Anemia, improved after transfusion  Recommendations  1. Agree with current therapy 2. Continue amiodarone drip for now. Convert to by mouth ( or NG tube ) Cardizem and metoprolol if patient clinically improves and eventually extubated 3. No further cardiac diagnostics at this time  Signed off for now, please call if any questions   Traeton Bordas, MD, PhD, [facility]  8:56 AM

## 2016-07-30 NOTE — Progress Notes (Signed)
Pharmacy Antibiotic Note  Joyce Horton is a 75 y.o. female admitted on 08/02/2016 with sepsis.  Pharmacy has been consulted for vancomycin and piperacillin/tazobactam dosing. Antibioitcs changed to vancomycin, ceftazidime, and metronidazole due to unknown source but possibly abdominal and renal insufficiency.   Plan: Will continue 750 mg iv q 48 hours for goal trough of 15-20. Will continue to monitor renal function daily. Will order trough as clinically indicated.    Continue ceftazidime 500 mg iv q 24 hours.    Will adjust abx dosing once CRRT initiated.   Height: 5' (152.4 cm) Weight: 166 lb 8 oz (75.5 kg) IBW/kg (Calculated) : 45.5  Temp (24hrs), Avg:98.9 F (37.2 C), Min:97.7 F (36.5 C), Max:100 F (37.8 C)   Recent Labs Lab 08/03/2016 1404 08/03/2016 1406 07/21/2016 1747 07/28/16 0410 07/29/16 0028 07/29/16 0523 07/29/16 0805  0438  WBC  --  16.9*  --  18.0* 27.6* 29.2*  --  32.1*  CREATININE  --  2.63*  --  2.11* 2.82* 2.95*  --  3.24*  LATICACIDVEN 4.1*  --  2.0*  --  6.0* 5.6* 4.3*  --     Estimated Creatinine Clearance: 13.2 mL/min (by C-G formula based on SCr of 3.24 mg/dL (H)).    Allergies  Allergen Reactions  . Fentanyl Other (See Comments)    Hallucinations and mental alterations    Antimicrobials this admission: vancomycin 10/22 >>  Piperacillin-tazobactam  10/22 >> 10/24 Ceftazidime 10/24 >> Metronidazole 10/24 >>  Dose adjustments this admission: Vancomycin decreased to 750 mg q24h 10/24  Microbiology results: 10/22 BCx: NGTD 10/22 UCx: E coli  10/22 MRSA PCR: negative    Pharmacy will continue to monitor and adjust per consult.   Ulice Dash D  11:10 AM

## 2016-07-30 NOTE — Procedures (Signed)
PROCEDURE NOTE: L FEMORAL HD CATH PLACEMENT  INDICATION:    AKI, anuria. Need for CRRT  CONSENT:   Risks of procedure as well as the alternatives were explained to the patient or surrogate. Consent for procedure obtained. A time out was performed.   PROCEDURE  Sterile technique was used including antiseptics, cap, gloves, gown, hand hygiene, mask and full body sheet.  Skin prep: Chlorhexidine; local anesthetic administered  A triple lumen HD catheter was placed in the L femoral vein using the Seldinger technique.  Ultrasound was used for vessel identification and guidance.   EVALUATION:  Blood flow good  Complications: No apparent complications  Patient tolerated the procedure well.   Merton Border, MD PCCM service Mobile [phone] Pager [phone] 

## 2016-07-31 LAB — BLOOD CULTURE ID PANEL (REFLEXED)
ACINETOBACTER BAUMANNII: NOT DETECTED
CANDIDA ALBICANS: NOT DETECTED
CANDIDA GLABRATA: NOT DETECTED
CANDIDA KRUSEI: NOT DETECTED
CANDIDA TROPICALIS: NOT DETECTED
Candida parapsilosis: NOT DETECTED
Carbapenem resistance: NOT DETECTED
ENTEROBACTER CLOACAE COMPLEX: NOT DETECTED
ESCHERICHIA COLI: DETECTED — AB
Enterobacteriaceae species: DETECTED — AB
Enterococcus species: NOT DETECTED
Haemophilus influenzae: NOT DETECTED
KLEBSIELLA OXYTOCA: NOT DETECTED
KLEBSIELLA PNEUMONIAE: NOT DETECTED
Listeria monocytogenes: NOT DETECTED
NEISSERIA MENINGITIDIS: NOT DETECTED
PROTEUS SPECIES: NOT DETECTED
Pseudomonas aeruginosa: NOT DETECTED
STREPTOCOCCUS AGALACTIAE: NOT DETECTED
STREPTOCOCCUS PNEUMONIAE: NOT DETECTED
Serratia marcescens: NOT DETECTED
Staphylococcus aureus (BCID): NOT DETECTED
Staphylococcus species: NOT DETECTED
Streptococcus pyogenes: NOT DETECTED
Streptococcus species: NOT DETECTED

## 2016-08-01 LAB — CULTURE, BLOOD (ROUTINE X 2): Culture: NO GROWTH

## 2016-08-02 LAB — CULTURE, BLOOD (ROUTINE X 2)

## 2016-08-06 DEATH — deceased

## 2016-08-07 ENCOUNTER — Other Ambulatory Visit: Payer: Medicare Other

## 2016-08-07 ENCOUNTER — Ambulatory Visit: Payer: Medicare Other | Admitting: Oncology

## 2016-08-11 ENCOUNTER — Ambulatory Visit: Payer: Medicare Other | Admitting: Oncology

## 2016-08-11 ENCOUNTER — Other Ambulatory Visit: Payer: Medicare Other

## 2017-06-13 IMAGING — CT CT ABD-PELV W/O CM
2 of 4 series · 16 of 46 positions shown, 18 images · non-contrast
Comparison: PET-CT 08/02/2009, kyphoplasty images from 07/10/2016

CLINICAL DATA: Altered mental status, hypoxia, sepsis of unknown
etiology, acute renal failure.

EXAM:
CT ABDOMEN AND PELVIS WITHOUT CONTRAST
TECHNIQUE: Multidetector CT imaging of the abdomen and pelvis was performed
following the standard protocol without IV contrast.

[Series 2: routine soft tissue · axial · 0.85mm/px · z∈[-366,+19]mm · 13 of 85 slices shown, 15 images]
[im 4/85  soft-tissue]
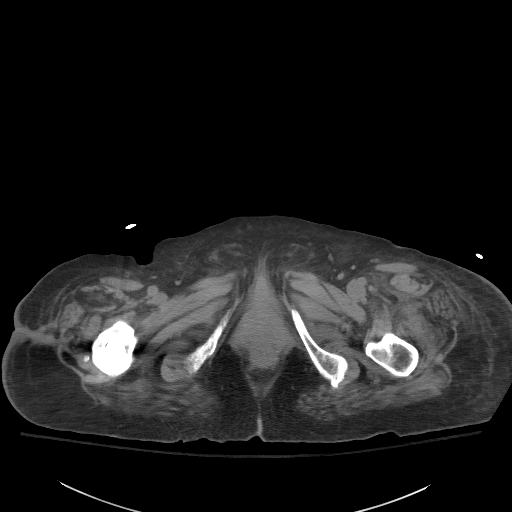
[im 4/85  bone]
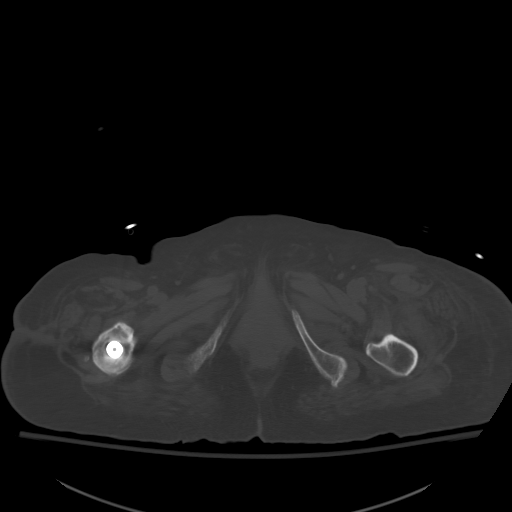
[im 10/85  soft-tissue]
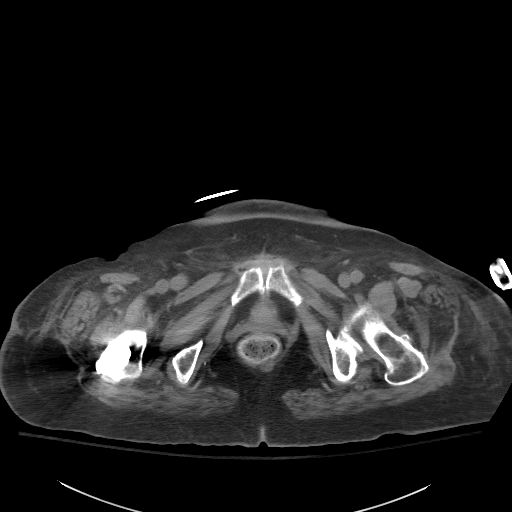
[im 17/85  soft-tissue]
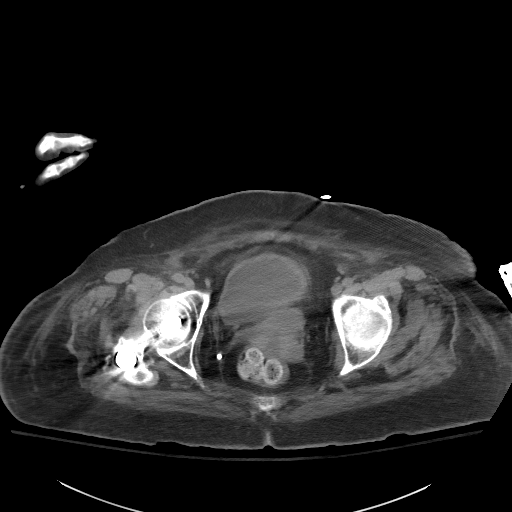
[im 23/85  soft-tissue]
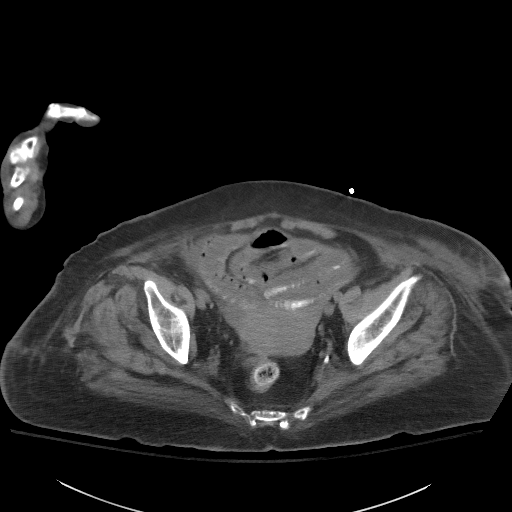
[im 30/85  soft-tissue]
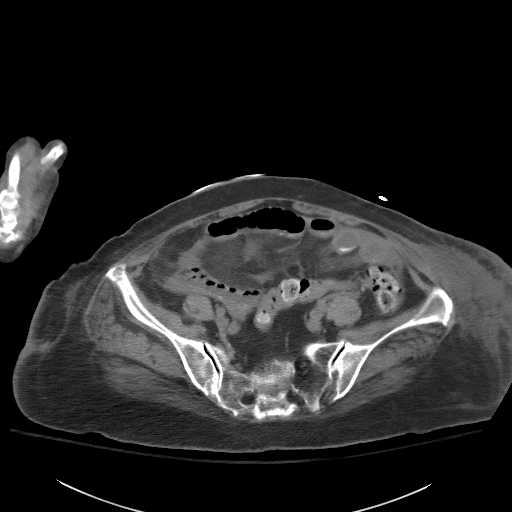
[im 36/85  soft-tissue]
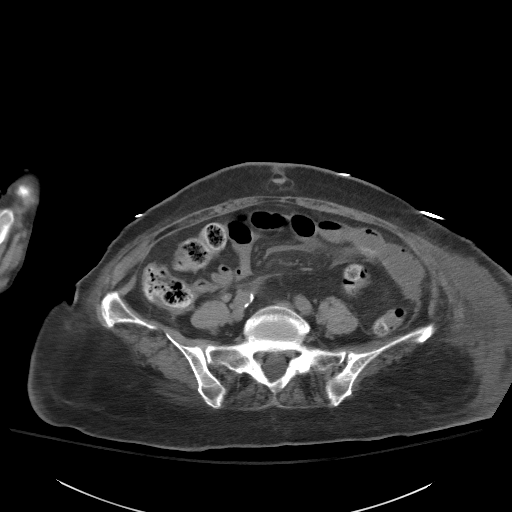
[im 43/85  soft-tissue]
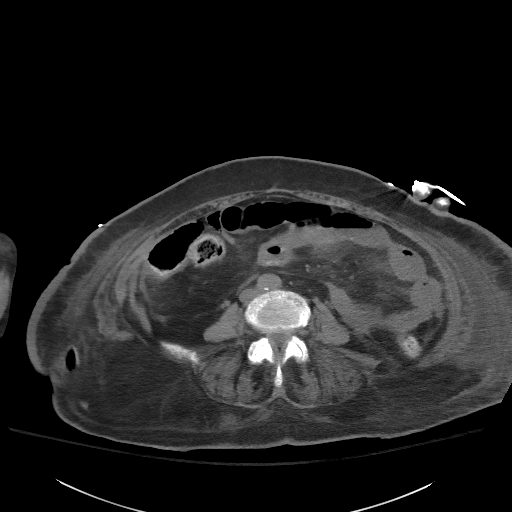
[im 49/85  soft-tissue]
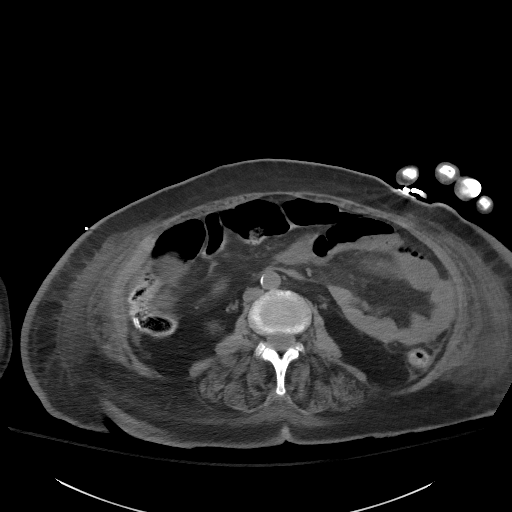
[im 55/85  soft-tissue]
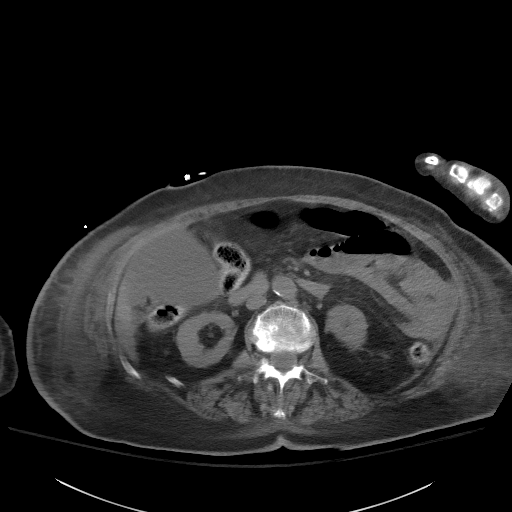
[im 55/85  bone]
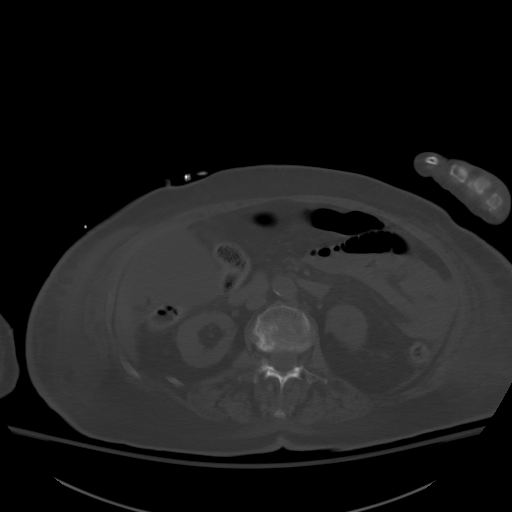
[im 62/85  soft-tissue]
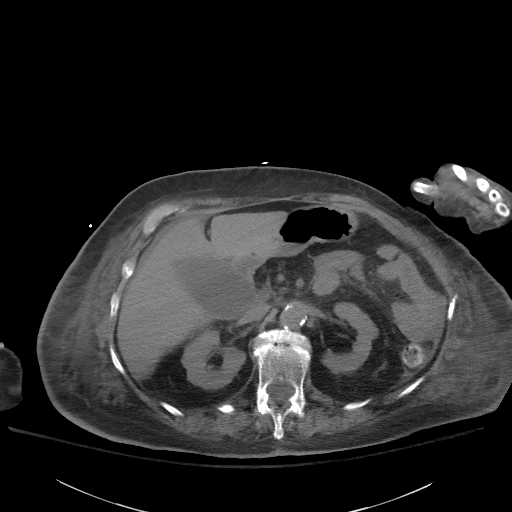
[im 68/85  soft-tissue]
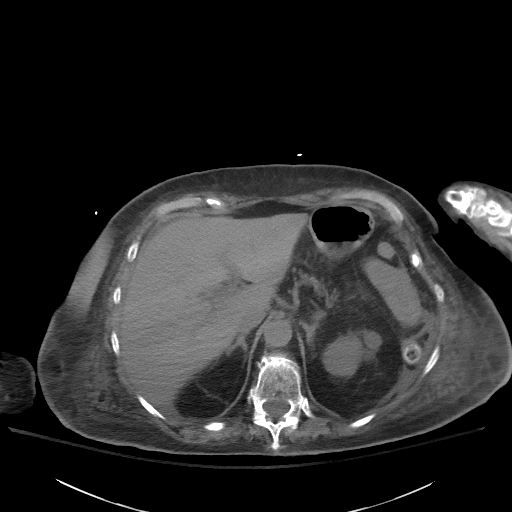
[im 75/85  soft-tissue]
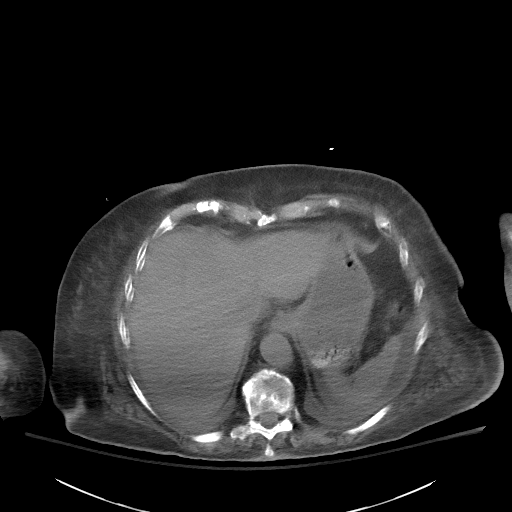
[im 81/85  soft-tissue]
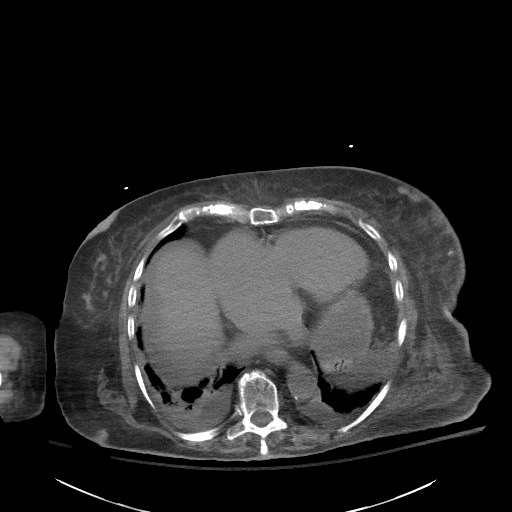

[Series 5: coronal st · coronal · 0.84mm/px · 3 of 82 slices shown]
[im 28/82  soft-tissue]
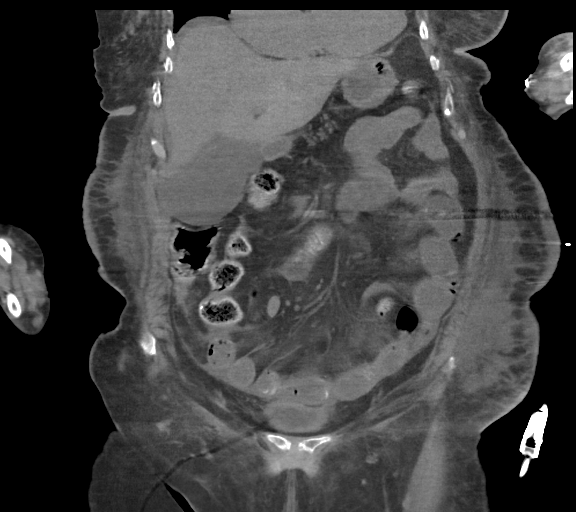
[im 37/82  soft-tissue]
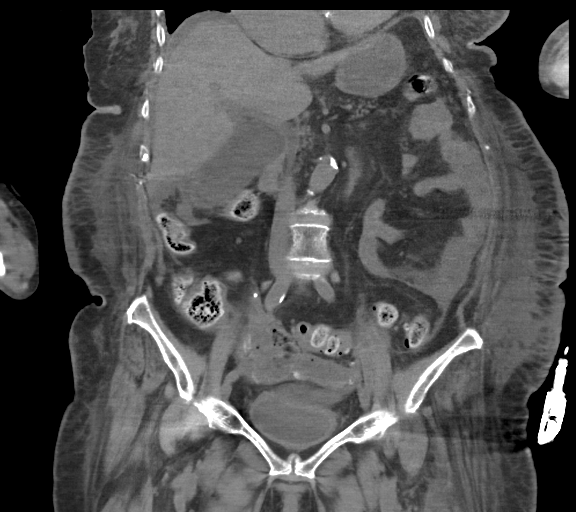
[im 46/82  soft-tissue]
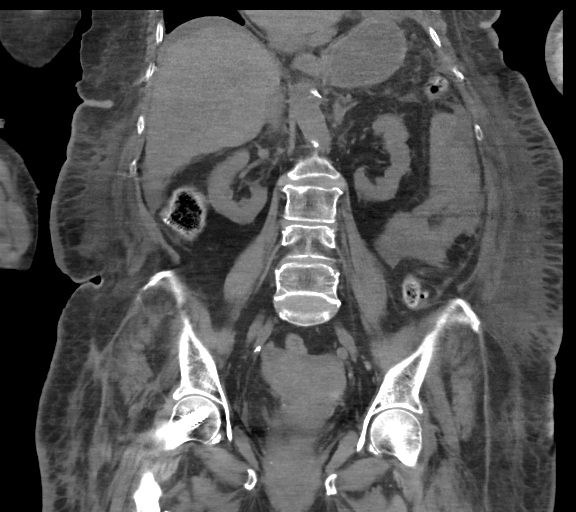

[16 of 46 positions shown; findings below may reference images not displayed]

FINDINGS: Lower chest: Cardiomegaly with coronary arteriosclerosis. No
pericardial effusion nor thickening. Small partially loculated right
pleural effusion with adjacent atelectasis. Small left pleural
effusion with compressive atelectasis.

Hepatobiliary: Gallbladder is distended with gallstones along the
dependent aspect. The unenhanced liver is unremarkable. No biliary
dilatation.

Pancreas: Atrophic without focal mass or ductal dilatation

Spleen: No splenomegaly

Adrenals/Urinary Tract: Normal adrenal glands bilaterally.
Nonobstructing left upper pole 2 mm renal calculus. Small exophytic
appearing cyst off the upper pole of the left kidney the larger
which measures 17 mm. Urinary bladder is nondistended and which may
account for the slightly thick-walled appearance.

Stomach/Bowel: No bowel age obstruction. There are few mildly
distended fluid and contrast filled small bowel loops predominantly
within the lower pelvis measuring up to 2.1 cm in caliber. The
appendix is not distended in appearance.

Vascular/Lymphatic: Mild aortic, right common iliac and side branch
atherosclerosis.

Reproductive: Calcified left-sided uterine fibroids.

Other: Small amount of ascites. Anasarca. Small fat containing
umbilical hernia.

Musculoskeletal: Right hip fixation with femoral nail. T10 chronic
moderate compression. T11 kyphoplasty. Degenerative disc disease
from T10 through L2.
IMPRESSION: 1. Moderate to marked distention of the gallbladder with
cholelithiasis. Cholecystitis is not excluded. Surrounding small
volume ascites about the liver, spleen and within the pelvis.
2. Nonobstructing left upper pole to mid ureteral calculus. Small
upper pole left renal cysts.
3. Partially loculated small right pleural effusion with
atelectasis. Small left effusion without definite loculations.
4. Multiple loops of mildly distended fluid filled small bowel
predominantly in the pelvis. Enteritis not excluded. No bowel
obstruction.
5. Cardiomegaly with coronary arteriosclerosis.

## 2017-06-14 IMAGING — DX DG ABDOMEN 1V
1 series · 1 of 1 positions shown · non-contrast
Comparison: CT 07/28/2016.

CLINICAL DATA: NG tube placement.

EXAM:
ABDOMEN - 1 VIEW

[abdomen kub]
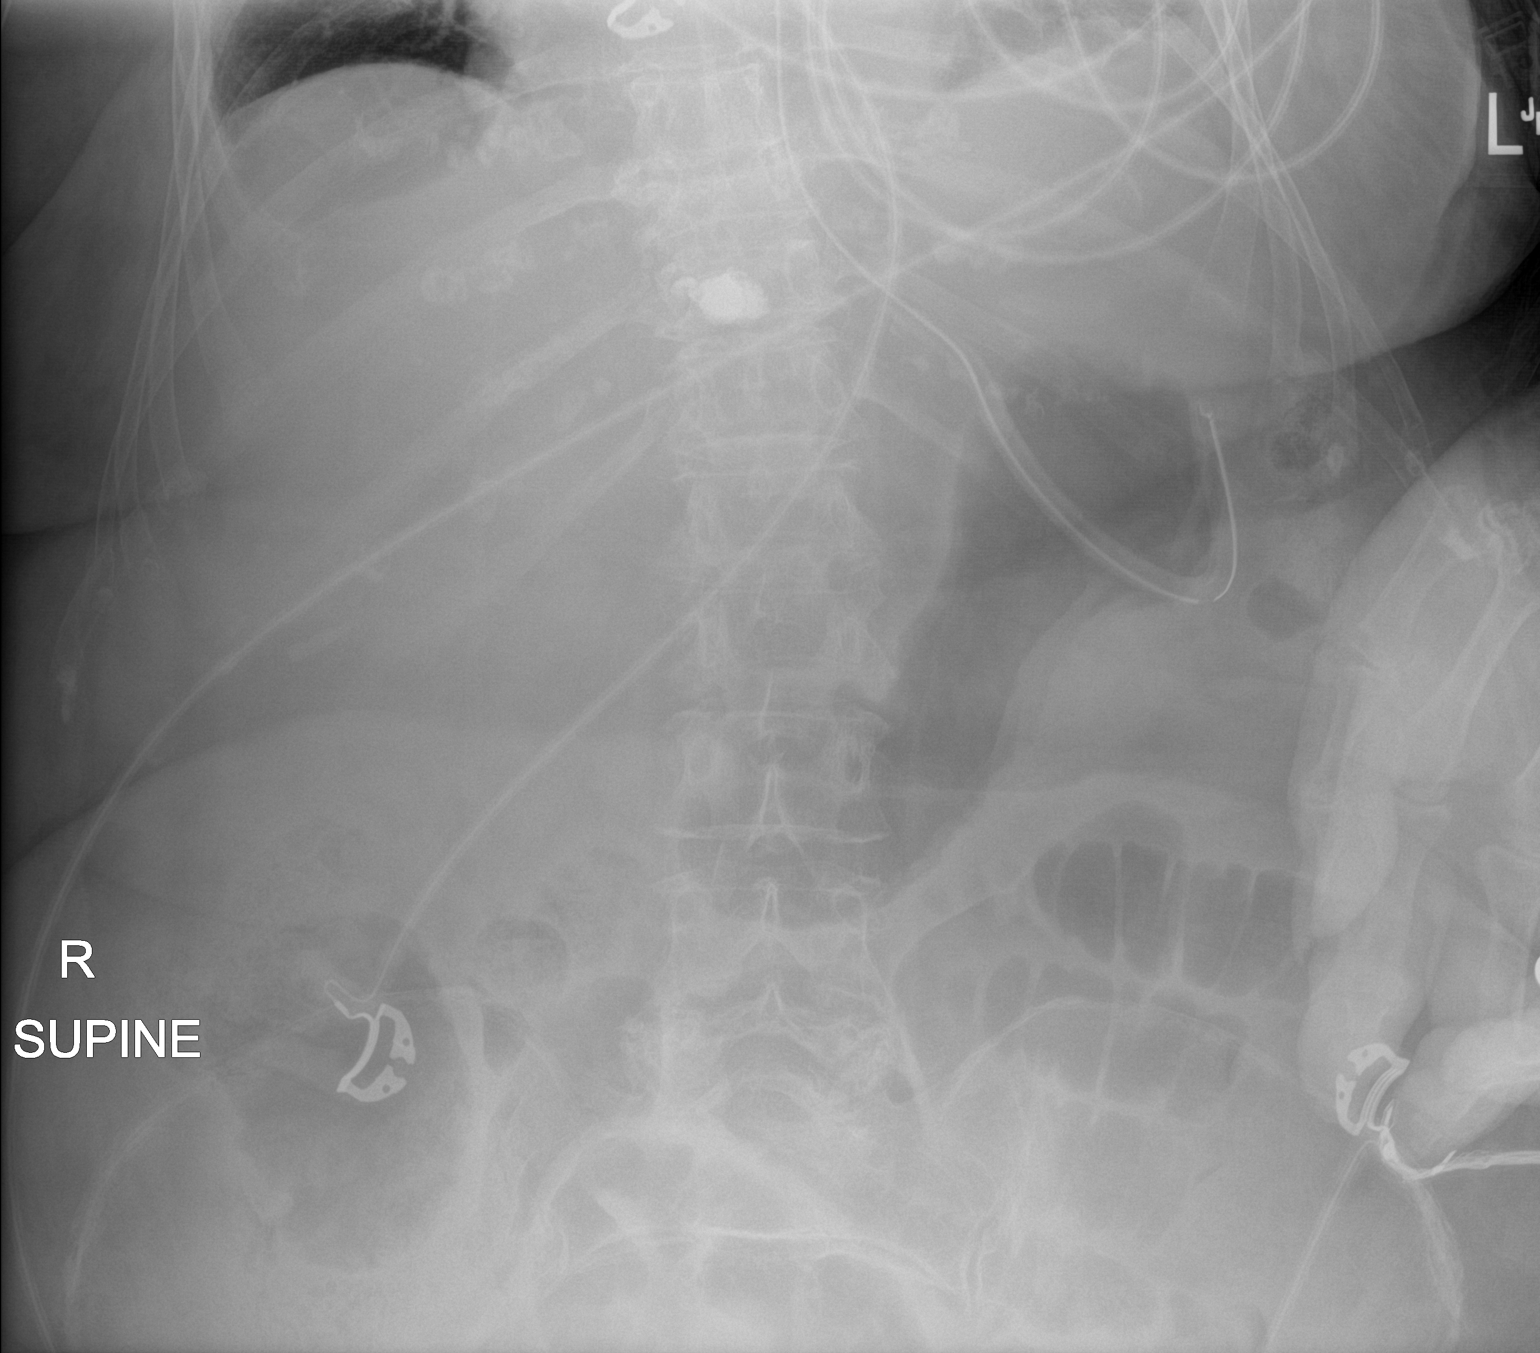

[1 of 1 positions shown; findings below may reference images not displayed]

FINDINGS: NG tube noted coiled in the stomach. Distended loops of small bowel
again noted. Colonic gas pattern is normal. No free air. Prior T11
vertebroplasty.
IMPRESSION: 1. NG tube noted coiled stomach.

2. Distended loops of small bowel again noted . Colonic gas pattern
normal. No free air.

## 2017-06-14 IMAGING — DX DG CHEST 1V PORT
1 series · 1 of 1 positions shown · non-contrast
Comparison: Chest radiograph dated 07/27/2016

CLINICAL DATA: 75-year-old female with central line placement.

EXAM:
PORTABLE CHEST 1 VIEW

[chest ap]
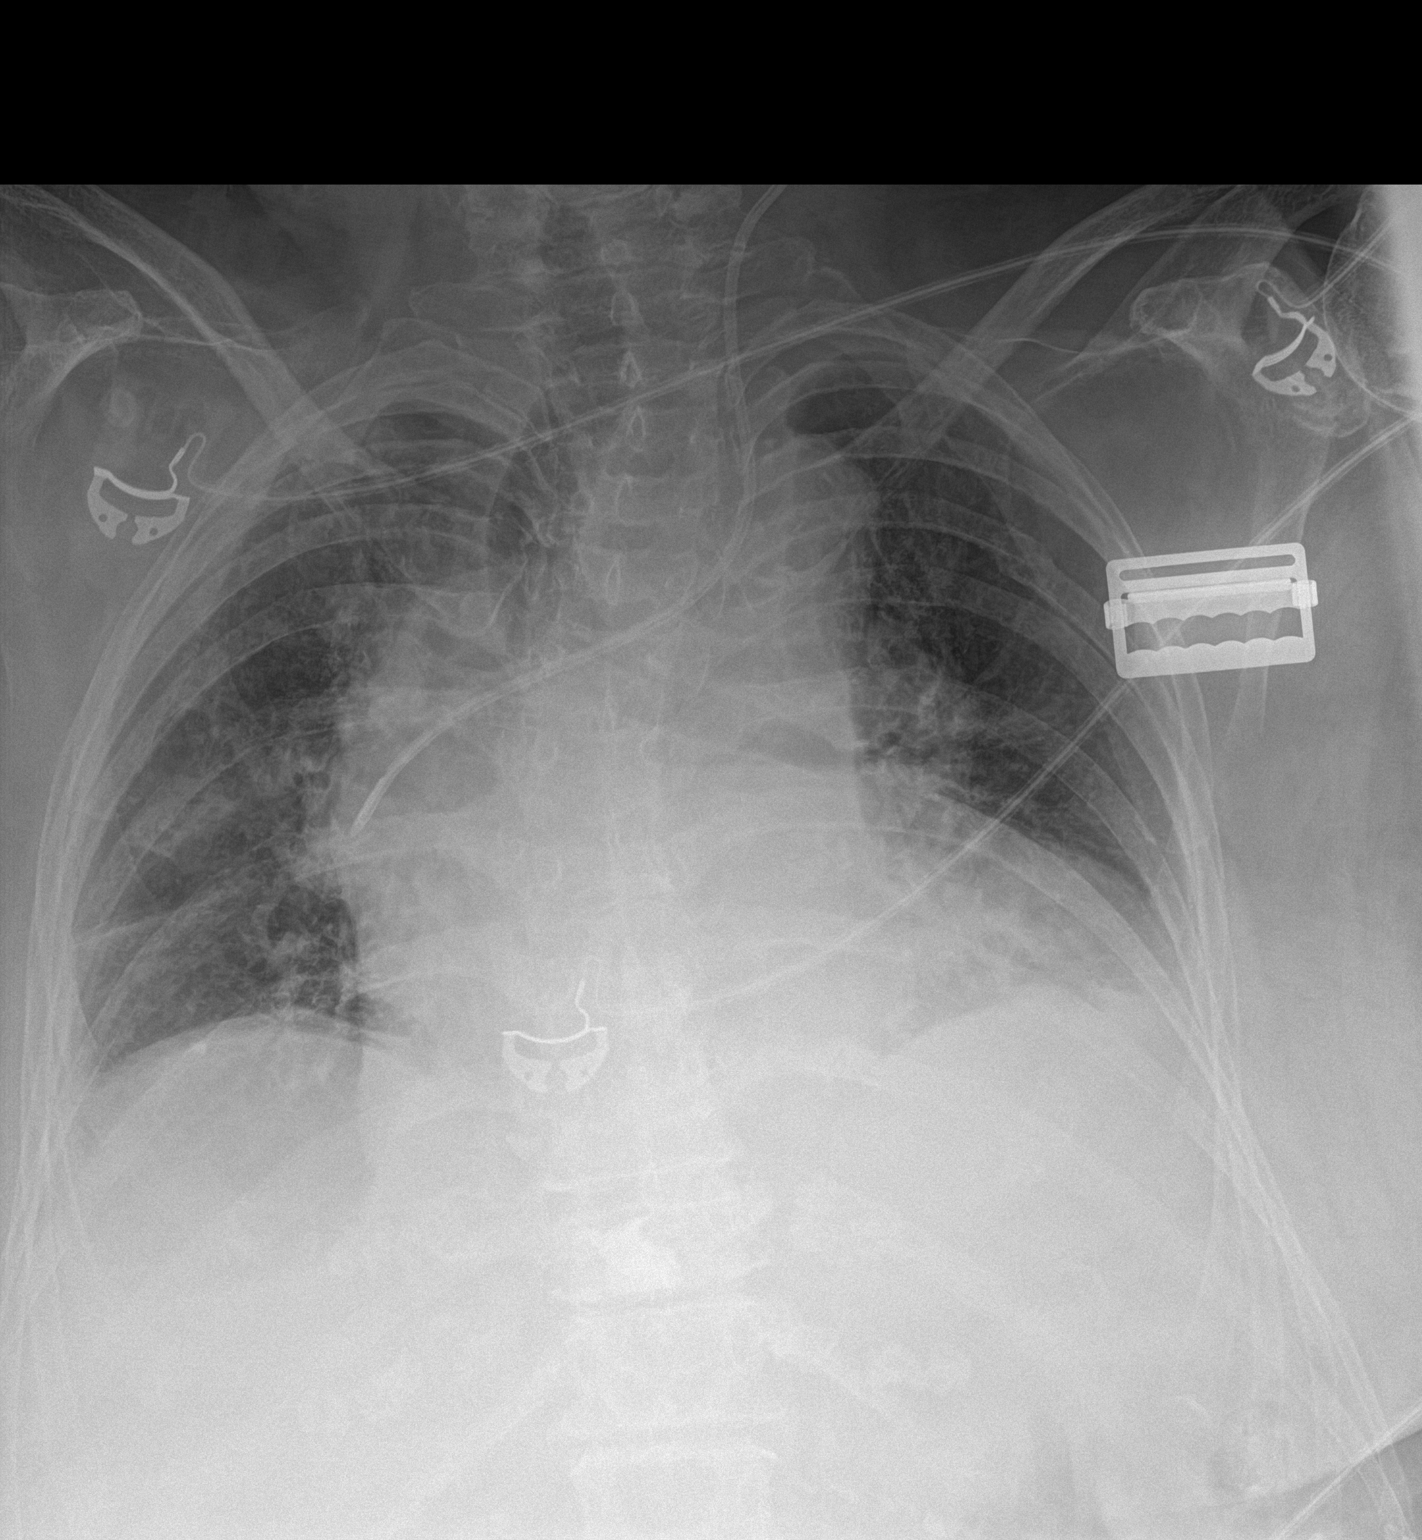

[1 of 1 positions shown; findings below may reference images not displayed]

FINDINGS: There has been interval placement of a left IJ central line with tip
over central SVC. No pneumothorax. There is no focal consolidation.
Blunted appearance of the costophrenic angles may be related to
atelectatic changes or trace pleural effusion. There is moderate to
severe cardiomegaly. There is widened appearance of the mediastinum
likely related to aortic tortuosity and slightly rotated positioning
of the patient. A mediastinal mass or vascular injury is not
entirely excluded. CT with contrast is recommended if there is high
clinical concern for mediastinal pathology. Osteopenia with thoracic
scoliosis and lower thoracic vertebroplasty. Sclerotic changes of
the left glenoid and humeral head. No definite acute fracture.
IMPRESSION: Left IJ central line with tip over central SVC.  No pneumothorax.

Widened appearance of the mediastinum relatively similar to prior
radiographs, likely related to cardiomegaly and tortuosity of the
aorta. Clinical correlation is recommended.

No focal consolidation.
